# Patient Record
Sex: Female | Born: 1955 | Race: White | Hispanic: No | Marital: Single | Smoking: Former smoker
Health system: Southern US, Community
[De-identification: ages and names within clinical notes are randomized; demographics above are authoritative.]

## PROBLEM LIST (undated history)

## (undated) DIAGNOSIS — F419 Anxiety disorder, unspecified: Secondary | ICD-10-CM

## (undated) DIAGNOSIS — F329 Major depressive disorder, single episode, unspecified: Secondary | ICD-10-CM

## (undated) DIAGNOSIS — F32A Depression, unspecified: Secondary | ICD-10-CM

## (undated) DIAGNOSIS — E785 Hyperlipidemia, unspecified: Secondary | ICD-10-CM

## (undated) DIAGNOSIS — M7710 Lateral epicondylitis, unspecified elbow: Secondary | ICD-10-CM

## (undated) DIAGNOSIS — K219 Gastro-esophageal reflux disease without esophagitis: Secondary | ICD-10-CM

## (undated) DIAGNOSIS — I10 Essential (primary) hypertension: Secondary | ICD-10-CM

## (undated) HISTORY — DX: Hyperlipidemia, unspecified: E78.5

## (undated) HISTORY — PX: WRIST ARTHROSCOPY: SUR100

## (undated) HISTORY — DX: Anxiety disorder, unspecified: F41.9

## (undated) HISTORY — DX: Major depressive disorder, single episode, unspecified: F32.9

## (undated) HISTORY — PX: TYMPANOPLASTY: SHX33

## (undated) HISTORY — DX: Depression, unspecified: F32.A

## (undated) HISTORY — DX: Essential (primary) hypertension: I10

## (undated) HISTORY — DX: Lateral epicondylitis, unspecified elbow: M77.10

---

## 1998-08-12 DIAGNOSIS — C4492 Squamous cell carcinoma of skin, unspecified: Secondary | ICD-10-CM

## 1998-08-12 HISTORY — DX: Squamous cell carcinoma of skin, unspecified: C44.92

## 1998-09-16 ENCOUNTER — Ambulatory Visit (HOSPITAL_BASED_OUTPATIENT_CLINIC_OR_DEPARTMENT_OTHER): Admission: RE | Admit: 1998-09-16 | Discharge: 1998-09-16 | Payer: Self-pay | Admitting: Plastic Surgery

## 1998-09-16 DIAGNOSIS — C4492 Squamous cell carcinoma of skin, unspecified: Secondary | ICD-10-CM

## 1998-09-16 HISTORY — DX: Squamous cell carcinoma of skin, unspecified: C44.92

## 2000-01-21 ENCOUNTER — Encounter: Admission: RE | Admit: 2000-01-21 | Discharge: 2000-01-21 | Payer: Self-pay | Admitting: *Deleted

## 2000-01-21 ENCOUNTER — Encounter: Payer: Self-pay | Admitting: *Deleted

## 2000-03-11 ENCOUNTER — Encounter: Admission: RE | Admit: 2000-03-11 | Discharge: 2000-06-09 | Payer: Self-pay | Admitting: *Deleted

## 2001-07-24 ENCOUNTER — Emergency Department (HOSPITAL_COMMUNITY): Admission: EM | Admit: 2001-07-24 | Discharge: 2001-07-25 | Payer: Self-pay | Admitting: Emergency Medicine

## 2001-07-24 ENCOUNTER — Encounter: Payer: Self-pay | Admitting: Emergency Medicine

## 2001-10-05 ENCOUNTER — Other Ambulatory Visit: Admission: RE | Admit: 2001-10-05 | Discharge: 2001-10-05 | Payer: Self-pay | Admitting: *Deleted

## 2001-12-09 ENCOUNTER — Other Ambulatory Visit: Admission: RE | Admit: 2001-12-09 | Discharge: 2001-12-09 | Payer: Self-pay | Admitting: Family Medicine

## 2002-05-22 ENCOUNTER — Encounter: Payer: Self-pay | Admitting: Emergency Medicine

## 2002-05-22 ENCOUNTER — Emergency Department (HOSPITAL_COMMUNITY): Admission: EM | Admit: 2002-05-22 | Discharge: 2002-05-22 | Payer: Self-pay | Admitting: Emergency Medicine

## 2004-03-31 ENCOUNTER — Ambulatory Visit: Payer: Self-pay | Admitting: Family Medicine

## 2004-05-07 ENCOUNTER — Ambulatory Visit: Payer: Self-pay | Admitting: Family Medicine

## 2004-05-07 ENCOUNTER — Other Ambulatory Visit: Admission: RE | Admit: 2004-05-07 | Discharge: 2004-05-07 | Payer: Self-pay | Admitting: Family Medicine

## 2004-05-30 ENCOUNTER — Ambulatory Visit: Payer: Self-pay | Admitting: Family Medicine

## 2004-06-04 ENCOUNTER — Ambulatory Visit: Payer: Self-pay | Admitting: Family Medicine

## 2004-06-10 ENCOUNTER — Ambulatory Visit: Payer: Self-pay | Admitting: Family Medicine

## 2007-09-17 ENCOUNTER — Emergency Department (HOSPITAL_COMMUNITY): Admission: EM | Admit: 2007-09-17 | Discharge: 2007-09-18 | Payer: Self-pay | Admitting: Emergency Medicine

## 2008-05-21 ENCOUNTER — Encounter: Admission: RE | Admit: 2008-05-21 | Discharge: 2008-06-21 | Payer: Self-pay | Admitting: Orthopedic Surgery

## 2009-03-01 ENCOUNTER — Other Ambulatory Visit: Admission: RE | Admit: 2009-03-01 | Discharge: 2009-03-01 | Payer: Self-pay | Admitting: Internal Medicine

## 2010-03-06 ENCOUNTER — Ambulatory Visit (HOSPITAL_COMMUNITY)
Admission: RE | Admit: 2010-03-06 | Discharge: 2010-03-06 | Payer: Self-pay | Source: Home / Self Care | Attending: Gastroenterology | Admitting: Gastroenterology

## 2010-03-06 HISTORY — PX: COLONOSCOPY W/ BIOPSIES: SHX1374

## 2010-08-04 ENCOUNTER — Other Ambulatory Visit: Payer: Self-pay | Admitting: Internal Medicine

## 2010-08-04 ENCOUNTER — Other Ambulatory Visit (HOSPITAL_COMMUNITY)
Admission: RE | Admit: 2010-08-04 | Discharge: 2010-08-04 | Disposition: A | Discharge status: Home / Self Care | Payer: 59 | Source: Ambulatory Visit | Attending: Internal Medicine | Admitting: Internal Medicine

## 2010-08-04 DIAGNOSIS — Z01419 Encounter for gynecological examination (general) (routine) without abnormal findings: Secondary | ICD-10-CM | POA: Insufficient documentation

## 2010-08-04 DIAGNOSIS — Z1159 Encounter for screening for other viral diseases: Secondary | ICD-10-CM | POA: Insufficient documentation

## 2010-12-09 ENCOUNTER — Other Ambulatory Visit: Payer: Self-pay | Admitting: Physician Assistant

## 2012-07-28 ENCOUNTER — Other Ambulatory Visit: Payer: Self-pay | Admitting: Physician Assistant

## 2013-02-01 ENCOUNTER — Ambulatory Visit (INDEPENDENT_AMBULATORY_CARE_PROVIDER_SITE_OTHER): Payer: 59 | Admitting: Sports Medicine

## 2013-02-01 ENCOUNTER — Encounter: Payer: Self-pay | Admitting: Sports Medicine

## 2013-02-01 VITALS — BP 149/92 | Ht 66.0 in | Wt 117.0 lb

## 2013-02-01 DIAGNOSIS — M7710 Lateral epicondylitis, unspecified elbow: Secondary | ICD-10-CM

## 2013-02-01 DIAGNOSIS — M7711 Lateral epicondylitis, right elbow: Secondary | ICD-10-CM

## 2013-02-01 MED ORDER — NITROGLYCERIN 0.2 MG/HR TD PT24: MEDICATED_PATCH | TRANSDERMAL | Status: DC

## 2013-02-01 NOTE — Progress Notes (Signed)
  Subjective:    Patient ID: Tiffany Curtis, female    DOB: Jun 04, 1955, 57 y.o.   MRN: [id number]  HPI chief complaint: Right elbow pain  Very pleasant 57 year old female comes in today complaining of 2 months of right elbow pain. Pain began while playing tennis. She had her tennis racket re-strung and she feels like the tension was way too much. Immediately after that she began to experience lateral elbow pain which has persisted for the past 8 weeks. Pain was initially only present with tennis but is now to the point to where it is interfering with her regular daily activities. She describes a burning sensation in the lateral elbow which is present mainly with wrist related activity such as picking up a coffee cup or turning a doorknob. He has purchased an off-the-shelf cockup wrist brace which has been somewhat helpful. No associated numbness or tingling. Pain improves at rest. She has not noticed any swelling. No prior elbow surgeries. No pain more proximally in her neck.  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 No acute distress. Vital signs are reviewed  Right elbow: Full range of motion. No effusion. No soft tissue swelling. There is tenderness to palpation directly over the lateral epicondyles with reproducible pain with resisted ECRB testing. No tenderness over the medial epicondyle. No tenderness to palpation at the radial tunnel. Elbow is stable to ligamentous exam. Good radial and ulnar pulses. Decreased grip strength secondary to pain.  MSK ultrasound of the right elbow: Limited images of the lateral elbow were obtained. There is a hypoechoic area along the superficial most portion of the common extensor tendon suggestive  of a tear here. It does not appear to be a full-thickness tear. There is also a traction spur seen at the lateral epicondyle.       Assessment & Plan:  Right elbow pain secondary to lateral epicondylitis with ultrasound evidence of probable partial common extensor tendon tear  1/4 patch nitroglycerin daily. Home eccentric exercises and stretching. She may continue with her wrist brace or alternatively she can try a counterforce brace if she finds it more comfortable. Followup with me in 4-6 weeks for repeat ultrasound. If symptoms persist or worsen I would consider the merits of a single cortisone injection prior to further imaging. Patient will call me with questions or concerns in the interim.

## 2013-02-01 NOTE — Patient Instructions (Signed)

## 2013-03-02 ENCOUNTER — Encounter: Payer: Self-pay | Admitting: Sports Medicine

## 2013-03-02 ENCOUNTER — Ambulatory Visit (INDEPENDENT_AMBULATORY_CARE_PROVIDER_SITE_OTHER): Payer: 59 | Admitting: Sports Medicine

## 2013-03-02 VITALS — BP 158/91 | HR 65 | Ht 66.0 in | Wt 117.0 lb

## 2013-03-02 DIAGNOSIS — M7710 Lateral epicondylitis, unspecified elbow: Secondary | ICD-10-CM

## 2013-03-02 DIAGNOSIS — M7711 Lateral epicondylitis, right elbow: Secondary | ICD-10-CM

## 2013-03-02 NOTE — Progress Notes (Signed)
   Subjective:    Patient ID: Tiffany Curtis, female    DOB: 14-Sep-1955, 57 y.o.   MRN: [id number]  HPI Patient comes in today for followup on right elbow lateral epicondylitis. She admits that she has not been compliant with her home exercises. She has however been using her topical nitroglycerin and has noted about a 10% improvement with this. She is tolerating it without headaches. She is not currently playing tennis. She is using a wrist brace on occasion. Currently her symptoms are tolerable.    Review of Systems     Objective:   Physical Exam Well-developed, well-nourished. No acute distress  Right elbow: Full range of motion. No effusion. There is still tenderness to palpation at the lateral epicondyle and reproducible pain with ECRB testing. No soft tissue swelling. Neurovascularly intact distally.  MSK ultrasound of the right elbow: Limited images of the lateral elbow were obtained. The hypoechoic area seen on her previous scan is once again appreciated. It is seen both in long and short view. It is unchanged when compared to the previous scan.       Assessment & Plan:  Right elbow pain secondary to lateral epicondylitis/tendinopathy  Patient assures me that she will be more compliant with her home exercises. Continue with nitroglycerin. She understands that this condition can take several weeks or months before resolving. I will see her back in the office in 4-6 weeks for repeat ultrasound. She will call me with questions or concerns in the interim.

## 2013-04-06 ENCOUNTER — Ambulatory Visit: Payer: 59 | Admitting: Sports Medicine

## 2013-04-24 ENCOUNTER — Ambulatory Visit (INDEPENDENT_AMBULATORY_CARE_PROVIDER_SITE_OTHER): Payer: 59 | Admitting: Sports Medicine

## 2013-04-24 ENCOUNTER — Encounter: Payer: Self-pay | Admitting: Sports Medicine

## 2013-04-24 VITALS — BP 166/114 | Ht 66.0 in | Wt 117.0 lb

## 2013-04-24 DIAGNOSIS — M7710 Lateral epicondylitis, unspecified elbow: Secondary | ICD-10-CM

## 2013-04-24 MED ORDER — NITROGLYCERIN 0.2 MG/HR TD PT24: MEDICATED_PATCH | TRANSDERMAL | Status: DC

## 2013-04-24 NOTE — Progress Notes (Signed)
Tiffany Curtis is a 58 y.o. female who presents to [facility] today for Lateral Epicondylitis (R)  R lateral epicondylitis: Decreased physical activity of the R arm w/ improvement. Doing exercises maybe 5 days out of the week but not w/ great effort. Also no longer wt lifting.  Accupuncture perhaps w/ some benefit. No medications. Improved overall.  Using Nitro patches as prescribed  HTN: BP elevated over past several reads in our officice. Sick today w/ URI and w/ decongestants on board. W/o HA or CP. HTN runs in family. No previous treatment for HTN.    PMH reviewed.  ROS as above otherwise neg Medications reviewed.  Exam:  BP 166/114  Ht 5\' 6"  (1.676 m)  Wt 117 lb (53.071 kg)  BMI 18.89 kg/m2 Gen: Well NAD MSK: FROM of R arm, mildly ttp along the lat epicondyle. Mild pain w/ resistance ECRB testing. No edema.   In office [place] w/ small spurring of the lat epicondyle and hypoechoic area that is diminished from previous examination likely indicating some degree of healing.    Assessment and Plan: 1) 58yo F w/ lateral epicondylitis that is improving. - continue nitro patches - continue exercises until able to meet w/ PT Jenny Reichmann) to determine further exercises - continue to stay out of tennis until after next appt - return in 4 wks. We'll repeat her ultrasound at that time. - seek attention from PCP for HTN. Precautions given for Hypertensive emergency  Linna Darner, MD Family Medicine PGY-3 04/24/2013, 10:31 AM

## 2013-04-24 NOTE — Patient Instructions (Signed)
Thank you for coming in today Your elbow is making progress Please continue your home exercises until you meet with Jenny Reichmann (physical therapist) to go over new ones Please continue the nitroglycerin patches Please return in 4 weeks.  Please follow up with your PCP regarding your blood pressure Have a wonderful day.

## 2013-05-22 ENCOUNTER — Ambulatory Visit (INDEPENDENT_AMBULATORY_CARE_PROVIDER_SITE_OTHER): Payer: 59 | Admitting: Sports Medicine

## 2013-05-22 ENCOUNTER — Encounter: Payer: Self-pay | Admitting: Sports Medicine

## 2013-05-22 VITALS — BP 148/86 | HR 54 | Ht 66.0 in | Wt 117.0 lb

## 2013-05-22 DIAGNOSIS — M7710 Lateral epicondylitis, unspecified elbow: Secondary | ICD-10-CM

## 2013-05-22 NOTE — Progress Notes (Signed)
   Subjective:    Patient ID: Tiffany Curtis, female    DOB: [birth year], 58 y.o.   MRN: [id number]  HPI Patient comes in today for followup on right elbow lateral epicondylitis. Since her last office visit she has started physical therapy. She is working with Vivi Ferns. Overall, she has had about a 50% improvement in her symptoms. She continues to use her nitroglycerin patch. She has not returned to tennis.    Review of Systems     Objective:   Physical Exam Well-developed, well-nourished. No acute distress. Awake alert and oriented x3  Right elbow: Full range of motion. No effusion. No soft tissue swelling. There is still some slight tenderness to palpation over the lateral epicondyle and reproducible pain with resisted ECRB testing, although not marked. Good grip strength. Neurovascular intact distally.  MSK ultrasound of the right elbow: Limited images of the lateral elbow were obtained. Once again seen is a small spur off of the lateral epicondyle. There is a small hypoechoic area at the insertion of the common extensor tendon but it is much smaller than on her previous exam.       Assessment & Plan:  Improving right elbow lateral epicondylitis  Continue with topical nitroglycerin. Continue to work with Vivi Ferns. Shanon Brow has recommended trying some dry needling and I do think this may be effective. Return to the office in 4 weeks for reevaluation and repeat ultrasound. No tennis in the interim. Call with questions or concerns in the interim.

## 2013-06-22 ENCOUNTER — Encounter: Payer: Self-pay | Admitting: Sports Medicine

## 2013-06-22 ENCOUNTER — Ambulatory Visit (INDEPENDENT_AMBULATORY_CARE_PROVIDER_SITE_OTHER): Payer: 59 | Admitting: Sports Medicine

## 2013-06-22 VITALS — BP 127/81 | HR 64 | Ht 66.0 in | Wt 117.0 lb

## 2013-06-22 DIAGNOSIS — M7710 Lateral epicondylitis, unspecified elbow: Secondary | ICD-10-CM

## 2013-06-23 NOTE — Progress Notes (Signed)
   Subjective:    Patient ID: Tiffany Curtis, female    DOB: October 01, 1955, 58 y.o.   MRN: [id number]  HPI Patient comes in today for followup on lateral epicondylitis of the right elbow. Overall, she is feeling much better. Minimal pain over the lateral epicondyle. She has returned to some very light tennis. She has completed physical therapy (she was working with Vivi Ferns). She is continuing to use a quarter patch of nitroglycerin daily. She is anxious to increase her tennis activity.    Review of Systems     Objective:   Physical Exam Well-developed, well-nourished. No acute distress. Awake alert and oriented x3. Vital signs reviewed.  Right elbow: Full range of motion. No effusion. No soft tissue swelling. Minimal tenderness to palpation over the lateral epicondyles. Minimal tenderness with resisted ECRB testing. Good grip strength. Good radial and ulnar pulses.  MSK ultrasound of the right elbow was performed. Limited images of the lateral elbow were obtained. Images were compared to prior scans. Once again appreciated is the spur off of the lateral epicondyle. The large hypoechoic area is seen within the common extensor tendon on previous scans is much less evident. In fact, it appears to have regained most of his normal structure. This finding is consistent with a nearly healed partial tear of the common extensor tendon.       Assessment & Plan:  Much improved lateral epicondylitis, right elbow  I've advised the patient to continue with her nitroglycerin patch for another 3 weeks and then discontinue it altogether. She understands the importance of continuing with her home exercises. I think she can start to increase her tennis activity as tolerated but she will make sure that she has a properly fitted racquet and will work with a Audiological scientist on proper form. Followup with me when necessary.

## 2014-07-25 ENCOUNTER — Ambulatory Visit (INDEPENDENT_AMBULATORY_CARE_PROVIDER_SITE_OTHER): Payer: 59 | Admitting: Internal Medicine

## 2014-07-25 ENCOUNTER — Encounter: Payer: Self-pay | Admitting: Internal Medicine

## 2014-07-25 VITALS — BP 134/76 | HR 64 | Ht 66.0 in | Wt 121.2 lb

## 2014-07-25 DIAGNOSIS — R1314 Dysphagia, pharyngoesophageal phase: Secondary | ICD-10-CM

## 2014-07-25 DIAGNOSIS — R1310 Dysphagia, unspecified: Secondary | ICD-10-CM

## 2014-07-25 DIAGNOSIS — R1319 Other dysphagia: Secondary | ICD-10-CM

## 2014-07-25 NOTE — Progress Notes (Signed)
Subjective:    Patient ID: Tiffany Curtis, female    DOB: 1956-02-09, 59 y.o.   MRN: [id number] Chief complaint: Esophageal spasms HPI The patient is here, self-referred Tiffany Curtis she has intermittent problems with chest pain and swallowing difficulty. It is not clearly completely related to eating but it seems to occur around that. It started several years ago where she got chest pain and a lot of saliva building up in her mouth and she felt like she couldn't swallow properly. Intermittent but more frequent. There is no classic sticking point but she gets this midsternal chest pain and she regurgitate small pieces of food. She thought she has probably had esophageal spasms and has just dealt with it. She has not talked other physicians about it. She previously had a colonoscopy with polyps by Dr. Earle Curtis and would be due for a routine repeat colonoscopy 5 years after the last at the end of this year. 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 There is no unintentional weight loss or bleeding or other problems. No Known Allergies Outpatient Prescriptions Prior to Visit  Medication Sig Dispense Refill  . losartan-hydrochlorothiazide (HYZAAR) 50-12.5 MG per tablet Take 1 tablet by mouth daily.    . nitroGLYCERIN (NITRODUR - DOSED IN MG/24 HR) 0.2 mg/hr patch Use 1/4 patch to the affected area every 24 hours 30 patch 1   No facility-administered medications prior to visit.   Past Medical History  Diagnosis Date  . Lateral epicondylitis (tennis elbow)     right  . Tubular adenoma of colon 03/06/2010  . Hypertension   . Anxiety and depression    Past Surgical History  Procedure Laterality Date  . Colonoscopy w/ biopsies  03/06/2010    Dr. Earle Curtis  . Wrist arthroscopy    . Tympanoplasty     History   Social History  . Marital Status: Single    Spouse Name: N/A  . Number of Children: N/A  . Years of Education: N/A   Social History Main Topics  .  Smoking status: Former Research scientist (life sciences)  . Smokeless tobacco: Never Used  . Alcohol Use: 0.0 oz/week    0 Standard drinks or equivalent per week  . Drug Use: No  . Sexual Activity: Not on file   Other Topics Concern  . 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 Liver cancer Mother   . Heart disease Father     Review of Systems As per history of present illness. She has seasonal allergies. All other review of systems are negative.    Objective:   Physical Exam @BP  134/76 mmHg  Pulse 64  Ht 5\' 6"  (1.676 m)  Wt 121 lb 3.2 oz (54.976 kg)  BMI 19.57 kg/m2@  General:  Well-developed, well-nourished and in no acute distress Eyes:  anicteric. ENT:   Mouth and posterior pharynx free of lesions.  Neck:   supple w/o thyromegaly or mass.  Lungs: Clear to auscultation bilaterally. Heart:  S1S2, no rubs, murmurs, gallops. Abdomen:  soft, non-tender, no hepatosplenomegaly, hernia, or mass and BS+.  Lymph:  no cervical or supraclavicular adenopathy. Extremities:   no edema, cyanosis or clubbing Neuro:  A&O x 3.  Psych:  appropriate mood and  Affect.   Data Reviewed:  2011 colonoscopy and pathology report      Assessment & Plan:  Esophageal dysphagia   Chronic problem - ? Motility vs stricture vs both Neoplasm seems very unlikely given duration and lack of other sxs  Plan for upper endoscopy and possible esophageal dilation  The risks and benefits as well as alternatives of endoscopic procedure(s) have been discussed and reviewed. All questions answered. The patient agrees to proceed.  I appreciate the opportunity to care for this patient.  CC: Tiffany NEVILL, MD

## 2014-07-25 NOTE — Patient Instructions (Signed)
  You have been scheduled for an endoscopy. Please follow written instructions given to you at your visit today. If you use inhalers (even only as needed), please bring them with you on the day of your procedure.   I appreciate the opportunity to care for you. Carl Gessner, MD, FACG 

## 2014-07-26 ENCOUNTER — Encounter: Payer: Self-pay | Admitting: Internal Medicine

## 2014-07-27 ENCOUNTER — Encounter: Payer: Self-pay | Admitting: Internal Medicine

## 2014-08-14 ENCOUNTER — Ambulatory Visit (AMBULATORY_SURGERY_CENTER): Payer: 59 | Admitting: Internal Medicine

## 2014-08-14 ENCOUNTER — Encounter: Payer: Self-pay | Admitting: Internal Medicine

## 2014-08-14 VITALS — BP 122/78 | HR 58 | Temp 97.3°F | Resp 18 | Ht 66.0 in | Wt 121.0 lb

## 2014-08-14 DIAGNOSIS — K222 Esophageal obstruction: Secondary | ICD-10-CM

## 2014-08-14 DIAGNOSIS — R1314 Dysphagia, pharyngoesophageal phase: Secondary | ICD-10-CM

## 2014-08-14 DIAGNOSIS — R1310 Dysphagia, unspecified: Secondary | ICD-10-CM

## 2014-08-14 DIAGNOSIS — R1319 Other dysphagia: Secondary | ICD-10-CM

## 2014-08-14 MED ORDER — PANTOPRAZOLE SODIUM 40 MG PO TBEC: 40.0000 mg | DELAYED_RELEASE_TABLET | Freq: Every day | ORAL | Status: DC

## 2014-08-14 MED ORDER — SODIUM CHLORIDE 0.9 % IV SOLN: 500.0000 mL | INTRAVENOUS | Status: DC

## 2014-08-14 NOTE — Progress Notes (Signed)
Called to room to assist during endoscopic procedure.  Patient ID and intended procedure confirmed with present staff. Received instructions for my participation in the procedure from the performing physician.  

## 2014-08-14 NOTE — Op Note (Signed)
[facility]  [address] [place], [id number]   ENDOSCOPY PROCEDURE REPORT  PATIENT: Tiffany Curtis, Tiffany Curtis  MR#: [id number] BIRTHDATE: 21-May-1955 , 58  yrs. old GENDER: female ENDOSCOPIST: Gatha Mayer, MD, [facility] PROCEDURE DATE:  08/14/2014 PROCEDURE:  EGD w/ balloon dilation ASA CLASS:     Class II INDICATIONS:  dysphagia. MEDICATIONS: Propofol 250 mg IV and Monitored anesthesia care TOPICAL ANESTHETIC: none  DESCRIPTION OF PROCEDURE: After the risks benefits and alternatives of the procedure were thoroughly explained, informed consent was obtained.  The LB [id number] endoscope was introduced through the mouth and advanced to the second portion of the duodenum , Without limitations.  The instrument was slowly withdrawn as the mucosa was fully examined.    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 2) small sliding hiatal hernia 2 cm 3) Otherwise normal EGD.  Retroflexed views revealed as previously described.     The scope was then withdrawn from the patient and the procedure completed.  COMPLICATIONS: There were no immediate complications.  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 2) small sliding hiatal hernia 2 cm 3) Otherwise normal EGD  RECOMMENDATIONS: Clear liquids until noon  , then soft foods rest of day.  Resume prior diet tomorrow. Start pantoprazole 40 mg daily - would maintain on this to reduce recurrent stricture formation   eSigned:  Gatha Mayer, MD, [facility] 08/14/2014 10:51 AM    CC: R. Marcellus Scott, MD and The Patient

## 2014-08-14 NOTE — Progress Notes (Signed)
A/ox3 pleased with MAC, report to Wendy RN 

## 2014-08-14 NOTE — Patient Instructions (Addendum)
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 I dilated it so you should be able to swallow better. This is almost always caused by esophageal reflux so I am prescribing pantoprazole - a PPI medication. I recommend you take this on a daily basis to reduce chance of recurrent stricture.  If I have not fixed your problem please call me back.  I appreciate the opportunity to care for you. Gatha Mayer, MD, FACG   YOU HAD AN ENDOSCOPIC PROCEDURE TODAY AT [facility]:   Refer to the procedure report that was given to you for any specific questions about what was found during the examination.  If the procedure report does not answer your questions, please call your gastroenterologist to clarify.  If you requested that your care partner not be given the details of your procedure findings, then the procedure report has been included in a sealed envelope for you to review at your convenience later.  YOU SHOULD EXPECT: Some feelings of bloating in the abdomen. Passage of more gas than usual.  Walking can help get rid of the air that was put into your GI tract during the procedure and reduce the bloating. If you had a lower endoscopy (such as a colonoscopy or flexible sigmoidoscopy) you may notice spotting of blood in your stool or on the toilet paper. If you underwent a bowel prep for your procedure, you may not have a normal bowel movement for a few days.  Please Note:  You might notice some irritation and congestion in your nose or some drainage.  This is from the oxygen used during your procedure.  There is no need for concern and it should clear up in a day or so.  SYMPTOMS TO REPORT IMMEDIATELY:   Following upper endoscopy (EGD)  Vomiting of blood or coffee ground material  New chest pain or pain under the shoulder blades  Painful or persistently difficult swallowing  New shortness of breath  Fever of 100F or higher  Black, tarry-looking stools  For urgent or  emergent issues, a gastroenterologist can be reached at any hour by calling [phone].   DIET: Your first meal following the procedure should be a small meal and then it is ok to progress to your normal diet. Heavy or fried foods are harder to digest and may make you feel nauseous or bloated.  Likewise, meals heavy in dairy and vegetables can increase bloating.  Drink plenty of fluids but you should avoid alcoholic beverages for 24 hours.  ACTIVITY:  You should plan to take it easy for the rest of today and you should NOT DRIVE or use heavy machinery until tomorrow (because of the sedation medicines used during the test).    FOLLOW UP: Our staff will call the number listed on your records the next business day following your procedure to check on you and address any questions or concerns that you may have regarding the information given to you following your procedure. If we do not reach you, we will leave a message.  However, if you are feeling well and you are not experiencing any problems, there is no need to return our call.  We will assume that you have returned to your regular daily activities without incident.  If any biopsies were taken you will be contacted by phone or by letter within the next 1-3 weeks.  Please call us at [phone] if you have not heard about the biopsies in 3  weeks.    SIGNATURES/CONFIDENTIALITY: You and/or your care partner have signed paperwork which will be entered into your electronic medical record.  These signatures attest to the fact that that the information above on your After Visit Summary has been reviewed and is understood.  Full responsibility of the confidentiality of this discharge information lies with you and/or your care-partner.  Please review stricture and dilation diet handouts provided.

## 2014-08-15 ENCOUNTER — Telehealth: Payer: Self-pay | Admitting: *Deleted

## 2014-08-15 NOTE — Telephone Encounter (Signed)
  Follow up Call-  Call back number 08/14/2014  Post procedure Call Back phone  # [phone]  Permission to leave phone message Yes     Patient questions:  Do you have a fever, pain , or abdominal swelling? No. Pain Score  0 *  Have you tolerated food without any problems? Yes.    Have you been able to return to your normal activities? Yes.    Do you have any questions about your discharge instructions: Diet   No. Medications  No. Follow up visit  No.  Do you have questions or concerns about your Care? No.  Actions: * If pain score is 4 or above: No action needed, pain <4.

## 2015-04-05 MED FILL — LEVOCETIRIZINE 5 MG TABLET: 5 | 30 days supply | Qty: 30 | Fill #1

## 2015-04-11 MED FILL — PRISTIQ ER 50 MG TABLET: 50 | 30 days supply | Qty: 30 | Fill #3

## 2015-04-15 DIAGNOSIS — Z1231 Encounter for screening mammogram for malignant neoplasm of breast: Secondary | ICD-10-CM | POA: Diagnosis not present

## 2015-05-09 MED FILL — PRISTIQ ER 50 MG TABLET: 50 | 30 days supply | Qty: 30 | Fill #4

## 2015-05-09 MED FILL — hydrOXYzine HCL 25 MG TABS: 25 | 30 days supply | Qty: 90 | Fill #1

## 2015-05-28 MED FILL — VYVANSE 40 MG CAPSULE: 40 | 90 days supply | Qty: 90 | Fill #0

## 2015-06-11 MED FILL — PRISTIQ ER 50 MG TABLET: 50 | 30 days supply | Qty: 30 | Fill #5

## 2015-06-24 MED FILL — LOSARTAN POTASSIUM 50 MG TA: 50 | 90 days supply | Qty: 90 | Fill #3

## 2015-07-12 MED FILL — DESVENLAFAXINE ER 50 MG TAB: 50 | 30 days supply | Qty: 30 | Fill #6

## 2015-07-18 DIAGNOSIS — F331 Major depressive disorder, recurrent, moderate: Secondary | ICD-10-CM | POA: Diagnosis not present

## 2015-07-18 DIAGNOSIS — F605 Obsessive-compulsive personality disorder: Secondary | ICD-10-CM | POA: Diagnosis not present

## 2015-07-31 DIAGNOSIS — M654 Radial styloid tenosynovitis [de Quervain]: Secondary | ICD-10-CM | POA: Diagnosis not present

## 2015-07-31 MED FILL — MELOXICAM 15 MG TABLET: 15 | 30 days supply | Qty: 30 | Fill #0

## 2015-08-01 DIAGNOSIS — M6281 Muscle weakness (generalized): Secondary | ICD-10-CM | POA: Diagnosis not present

## 2015-08-01 DIAGNOSIS — M79602 Pain in left arm: Secondary | ICD-10-CM | POA: Diagnosis not present

## 2015-08-01 DIAGNOSIS — M654 Radial styloid tenosynovitis [de Quervain]: Secondary | ICD-10-CM | POA: Diagnosis not present

## 2015-08-02 MED FILL — hydrOXYzine HCL 25 MG TABS: 25 | 30 days supply | Qty: 90 | Fill #2

## 2015-08-06 DIAGNOSIS — M79602 Pain in left arm: Secondary | ICD-10-CM | POA: Diagnosis not present

## 2015-08-06 DIAGNOSIS — M6281 Muscle weakness (generalized): Secondary | ICD-10-CM | POA: Diagnosis not present

## 2015-08-06 DIAGNOSIS — M654 Radial styloid tenosynovitis [de Quervain]: Secondary | ICD-10-CM | POA: Diagnosis not present

## 2015-08-08 DIAGNOSIS — M654 Radial styloid tenosynovitis [de Quervain]: Secondary | ICD-10-CM | POA: Diagnosis not present

## 2015-08-08 DIAGNOSIS — M6281 Muscle weakness (generalized): Secondary | ICD-10-CM | POA: Diagnosis not present

## 2015-08-08 DIAGNOSIS — M79602 Pain in left arm: Secondary | ICD-10-CM | POA: Diagnosis not present

## 2015-08-12 MED FILL — DESVENLAFAXINE ER 50 MG TAB: 50 | 30 days supply | Qty: 30 | Fill #7

## 2015-08-13 DIAGNOSIS — M79602 Pain in left arm: Secondary | ICD-10-CM | POA: Diagnosis not present

## 2015-08-13 DIAGNOSIS — M654 Radial styloid tenosynovitis [de Quervain]: Secondary | ICD-10-CM | POA: Diagnosis not present

## 2015-08-13 DIAGNOSIS — M6281 Muscle weakness (generalized): Secondary | ICD-10-CM | POA: Diagnosis not present

## 2015-08-28 MED FILL — VYVANSE 40 MG CAPSULE: 40 | 90 days supply | Qty: 90 | Fill #0

## 2015-09-02 DIAGNOSIS — H1032 Unspecified acute conjunctivitis, left eye: Secondary | ICD-10-CM | POA: Diagnosis not present

## 2015-09-02 MED FILL — TOBRAMYCIN-DEXAMETH OPTH SU: 0.3-0.1 | 25 days supply | Qty: 5 | Fill #0

## 2015-09-10 DIAGNOSIS — S0502XD Injury of conjunctiva and corneal abrasion without foreign body, left eye, subsequent encounter: Secondary | ICD-10-CM | POA: Diagnosis not present

## 2015-09-12 MED FILL — DESVENLAFAXINE ER 50 MG TAB: 50 | 90 days supply | Qty: 90 | Fill #0

## 2015-09-13 DIAGNOSIS — B0052 Herpesviral keratitis: Secondary | ICD-10-CM | POA: Diagnosis not present

## 2015-09-13 MED FILL — ZIRGAN 0.15% OPHTHALMIC GEL: 0.15 | 15 days supply | Qty: 5 | Fill #0

## 2015-09-15 DIAGNOSIS — B0052 Herpesviral keratitis: Secondary | ICD-10-CM | POA: Diagnosis not present

## 2015-09-23 DIAGNOSIS — B0052 Herpesviral keratitis: Secondary | ICD-10-CM | POA: Diagnosis not present

## 2015-09-25 DIAGNOSIS — Z79899 Other long term (current) drug therapy: Secondary | ICD-10-CM | POA: Diagnosis not present

## 2015-09-25 DIAGNOSIS — G4700 Insomnia, unspecified: Secondary | ICD-10-CM | POA: Diagnosis not present

## 2015-09-25 DIAGNOSIS — K222 Esophageal obstruction: Secondary | ICD-10-CM | POA: Diagnosis not present

## 2015-09-25 DIAGNOSIS — F909 Attention-deficit hyperactivity disorder, unspecified type: Secondary | ICD-10-CM | POA: Diagnosis not present

## 2015-09-25 DIAGNOSIS — E785 Hyperlipidemia, unspecified: Secondary | ICD-10-CM | POA: Diagnosis not present

## 2015-09-25 DIAGNOSIS — F329 Major depressive disorder, single episode, unspecified: Secondary | ICD-10-CM | POA: Diagnosis not present

## 2015-09-25 DIAGNOSIS — H9190 Unspecified hearing loss, unspecified ear: Secondary | ICD-10-CM | POA: Diagnosis not present

## 2015-09-25 DIAGNOSIS — I10 Essential (primary) hypertension: Secondary | ICD-10-CM | POA: Diagnosis not present

## 2015-09-25 DIAGNOSIS — Z0001 Encounter for general adult medical examination with abnormal findings: Secondary | ICD-10-CM | POA: Diagnosis not present

## 2015-10-02 ENCOUNTER — Other Ambulatory Visit: Payer: Self-pay | Admitting: Gastroenterology

## 2015-10-02 MED FILL — GAVILYTE-N SOLUTION: 420 | 1 days supply | Qty: 4000 | Fill #0

## 2015-10-03 MED FILL — LOSARTAN POTASSIUM 50 MG TA: 50 | 90 days supply | Qty: 90 | Fill #0

## 2015-10-10 MED FILL — hydrOXYzine HCL 25 MG TABS: 25 | 30 days supply | Qty: 90 | Fill #3

## 2015-11-08 ENCOUNTER — Encounter (HOSPITAL_COMMUNITY): Payer: Self-pay | Admitting: *Deleted

## 2015-11-13 DIAGNOSIS — J301 Allergic rhinitis due to pollen: Secondary | ICD-10-CM | POA: Diagnosis not present

## 2015-11-13 DIAGNOSIS — H1045 Other chronic allergic conjunctivitis: Secondary | ICD-10-CM | POA: Diagnosis not present

## 2015-11-13 DIAGNOSIS — L298 Other pruritus: Secondary | ICD-10-CM | POA: Diagnosis not present

## 2015-11-13 DIAGNOSIS — J3089 Other allergic rhinitis: Secondary | ICD-10-CM | POA: Diagnosis not present

## 2015-11-18 ENCOUNTER — Encounter (HOSPITAL_COMMUNITY): Payer: Self-pay | Admitting: *Deleted

## 2015-11-18 ENCOUNTER — Ambulatory Visit (HOSPITAL_COMMUNITY)
Admission: RE | Admit: 2015-11-18 | Discharge: 2015-11-18 | Disposition: A | Discharge status: Home / Self Care | Payer: 59 | Source: Ambulatory Visit | Attending: Gastroenterology | Admitting: Gastroenterology

## 2015-11-18 ENCOUNTER — Ambulatory Visit (HOSPITAL_COMMUNITY): Payer: 59 | Admitting: Registered Nurse

## 2015-11-18 ENCOUNTER — Encounter (HOSPITAL_COMMUNITY)
Admission: RE | Disposition: A | Discharge status: Home / Self Care | Payer: Self-pay | Source: Ambulatory Visit | Attending: Gastroenterology

## 2015-11-18 DIAGNOSIS — J3081 Allergic rhinitis due to animal (cat) (dog) hair and dander: Secondary | ICD-10-CM | POA: Diagnosis not present

## 2015-11-18 DIAGNOSIS — K219 Gastro-esophageal reflux disease without esophagitis: Secondary | ICD-10-CM | POA: Diagnosis not present

## 2015-11-18 DIAGNOSIS — F329 Major depressive disorder, single episode, unspecified: Secondary | ICD-10-CM | POA: Diagnosis not present

## 2015-11-18 DIAGNOSIS — Z1211 Encounter for screening for malignant neoplasm of colon: Secondary | ICD-10-CM | POA: Insufficient documentation

## 2015-11-18 DIAGNOSIS — J301 Allergic rhinitis due to pollen: Secondary | ICD-10-CM | POA: Diagnosis not present

## 2015-11-18 DIAGNOSIS — Z86010 Personal history of colonic polyps: Secondary | ICD-10-CM | POA: Diagnosis not present

## 2015-11-18 DIAGNOSIS — J3089 Other allergic rhinitis: Secondary | ICD-10-CM | POA: Diagnosis not present

## 2015-11-18 DIAGNOSIS — Z79899 Other long term (current) drug therapy: Secondary | ICD-10-CM | POA: Insufficient documentation

## 2015-11-18 DIAGNOSIS — M1990 Unspecified osteoarthritis, unspecified site: Secondary | ICD-10-CM | POA: Diagnosis not present

## 2015-11-18 DIAGNOSIS — I10 Essential (primary) hypertension: Secondary | ICD-10-CM | POA: Diagnosis not present

## 2015-11-18 DIAGNOSIS — Z87891 Personal history of nicotine dependence: Secondary | ICD-10-CM | POA: Insufficient documentation

## 2015-11-18 HISTORY — DX: Gastro-esophageal reflux disease without esophagitis: K21.9

## 2015-11-18 HISTORY — PX: COLONOSCOPY WITH PROPOFOL: SHX5780

## 2015-11-18 SURGERY — COLONOSCOPY WITH PROPOFOL
Anesthesia: Monitor Anesthesia Care

## 2015-11-18 MED ORDER — SODIUM CHLORIDE 0.9 % IV SOLN: INTRAVENOUS | Status: DC

## 2015-11-18 MED ORDER — SODIUM CHLORIDE 0.9 % IJ SOLN
INTRAMUSCULAR | Status: AC
  Filled 2015-11-18: qty 10

## 2015-11-18 MED ORDER — LACTATED RINGERS IV SOLN
INTRAVENOUS | Status: DC
  Administered 2015-11-18: 1000 mL via INTRAVENOUS

## 2015-11-18 MED ORDER — PROPOFOL 10 MG/ML IV BOLUS
INTRAVENOUS | Status: AC
  Filled 2015-11-18: qty 20

## 2015-11-18 MED ORDER — EPHEDRINE SULFATE 50 MG/ML IJ SOLN
INTRAMUSCULAR | Status: AC
Start: 2015-11-18 — End: 2015-11-18
  Filled 2015-11-18: qty 1

## 2015-11-18 MED ORDER — LIDOCAINE HCL (CARDIAC) 20 MG/ML IV SOLN
INTRAVENOUS | Status: AC
  Filled 2015-11-18: qty 5

## 2015-11-18 MED ORDER — LIDOCAINE HCL (CARDIAC) 20 MG/ML IV SOLN
INTRAVENOUS | Status: DC | PRN
  Administered 2015-11-18: 100 mg via INTRAVENOUS

## 2015-11-18 MED ORDER — PROPOFOL 10 MG/ML IV BOLUS
INTRAVENOUS | Status: DC | PRN
  Administered 2015-11-18 (×6): 20 mg via INTRAVENOUS
  Administered 2015-11-18: 10 mg via INTRAVENOUS

## 2015-11-18 MED ORDER — PROPOFOL 500 MG/50ML IV EMUL
INTRAVENOUS | Status: DC | PRN
  Administered 2015-11-18: 120 ug/kg/min via INTRAVENOUS

## 2015-11-18 MED ORDER — PROPOFOL 10 MG/ML IV BOLUS
INTRAVENOUS | Status: AC
  Filled 2015-11-18: qty 40

## 2015-11-18 SURGICAL SUPPLY — 22 items

## 2015-11-18 NOTE — H&P (Signed)
  Procedure: Surveillance colonoscopy. 03/06/2010 Baseline screening colonoscopy was performed with removal of three small tubular adenomatous and hyperplastic colon polyps  History: The patient is a 61 year old female born [birth year]. She is scheduled to undergo a surveillance colonoscopy today.  Past medical history: Depression. Hypertension. Ragweed allergy. Wrist surgery. Right breast biopsy.  Medication allergies: Metoprolol causes diarrhea  Exam: The patient is alert and lying comfortably on the endoscopy stretcher. Abdomen is soft and nontender to palpation. Lungs are clear to auscultation. Cardiac exam reveals a regular rhythm.  Plan: Proceed with surveillance colonoscopy

## 2015-11-18 NOTE — Discharge Instructions (Signed)

## 2015-11-18 NOTE — Anesthesia Postprocedure Evaluation (Signed)
Anesthesia Post Note  Patient: Tiffany Curtis  Procedure(s) Performed: Procedure(s) (LRB): COLONOSCOPY WITH PROPOFOL (N/A)  Patient location during evaluation: Endoscopy Anesthesia Type: MAC Level of consciousness: awake Pain management: pain level controlled Vital Signs Assessment: post-procedure vital signs reviewed and stable Respiratory status: spontaneous breathing Cardiovascular status: stable Anesthetic complications: no    Last Vitals:  Vitals:   11/18/15 0830 11/18/15 0840  BP: 112/60 125/70  Pulse:  (!) 50  Resp:    Temp:      Last Pain:  Vitals:   11/18/15 0814  TempSrc: Oral                 EDWARDS,Crissy Mccreadie

## 2015-11-18 NOTE — Op Note (Signed)
[facility] Patient Name: Tiffany Curtis Procedure Date: 11/18/2015 MRN: [id number] Attending MD: Garlan Fair , MD Date of Birth: June 17, 1955 CSN: [id number] Age: 60 Admit Type: Outpatient Procedure:                Colonoscopy Indications:              High risk colon cancer surveillance: Personal                            history of adenoma less than 10 mm in size Providers:                Garlan Fair, MD, Laverta Baltimore RN, RN, Alfonso Patten, Technician, Courtney Heys. Armistead, CRNA Referring MD:              Medicines:                Propofol per Anesthesia Complications:            No immediate complications. Estimated Blood Loss:     Estimated blood loss: none. Procedure:                Pre-Anesthesia Assessment:                           - Prior to the procedure, a History and Physical                            was performed, and patient medications and                            allergies were reviewed. The patient's tolerance of                            previous anesthesia was also reviewed. The risks                            and benefits of the procedure and the sedation                            options and risks were discussed with the patient.                            All questions were answered, and informed consent                            was obtained. Prior Anticoagulants: The patient has                            taken no previous anticoagulant or antiplatelet                            agents. ASA Grade Assessment: II - A patient with  mild systemic disease. After reviewing the risks                            and benefits, the patient was deemed in                            satisfactory condition to undergo the procedure.                           After obtaining informed consent, the colonoscope                            was passed under direct vision. Throughout the             procedure, the patient's blood pressure, pulse, and                            oxygen saturations were monitored continuously. The                            [id number] scope was introduced through                            the anus and advanced to the the cecum, identified                            by appendiceal orifice and ileocecal valve. The                            colonoscopy was somewhat difficult due to                            significant looping. The patient tolerated the                            procedure well. The quality of the bowel                            preparation was good. The appendiceal orifice and                            the rectum were photographed. Scope In: 7:39:40 AM Scope Out: 8:07:53 AM Scope Withdrawal Time: 0 hours 11 minutes 7 seconds  Total Procedure Duration: 0 hours 28 minutes 13 seconds  Findings:      The perianal and digital rectal examinations were normal.      The entire examined colon appeared normal. Impression:               - The entire examined colon is normal.                           - No specimens collected. Moderate Sedation:      N/A- Per Anesthesia Care Recommendation:           - Patient has a contact number available for  emergencies. The signs and symptoms of potential                            delayed complications were discussed with the                            patient. Return to normal activities tomorrow.                            Written discharge instructions were provided to the                            patient.                           - Repeat colonoscopy in 5 years for surveillance.                           - Resume previous diet.                           - Continue present medications. Procedure Code(s):        --- Professional ---                           [id number], Colorectal cancer screening; colonoscopy on                            individual at high  risk Diagnosis Code(s):        --- Professional ---                           Z86.010, Personal history of colonic polyps CPT copyright 2016 American Medical Association. All rights reserved. The codes documented in this report are preliminary and upon coder review may  be revised to meet current compliance requirements. Earle Gell, MD Garlan Fair, MD 11/18/2015 8:15:14 AM This report has been signed electronically. Number of Addenda: 0

## 2015-11-18 NOTE — Transfer of Care (Signed)
Immediate Anesthesia Transfer of Care Note  Patient: Tiffany Curtis  Procedure(s) Performed: Procedure(s): COLONOSCOPY WITH PROPOFOL (N/A)  Patient Location: PACU  Anesthesia Type:MAC  Level of Consciousness: awake, alert , oriented and patient cooperative  Airway & Oxygen Therapy: Patient Spontanous Breathing and Patient connected to face mask oxygen  Post-op Assessment: Report given to RN, Post -op Vital signs reviewed and stable and Patient moving all extremities  Post vital signs: Reviewed and stable  Last Vitals:  Vitals:   11/18/15 0655  BP: (!) 153/78  Pulse: (!) 51  Resp: 10  Temp: 36.7 C    Last Pain:  Vitals:   11/18/15 0655  TempSrc: Oral         Complications: No apparent anesthesia complications

## 2015-11-18 NOTE — Anesthesia Preprocedure Evaluation (Addendum)
Anesthesia Evaluation  Patient identified by MRN, date of birth, ID band Patient awake    Reviewed: Allergy & Precautions, NPO status , Patient's Chart, lab work & pertinent test results  Airway Mallampati: II  TM Distance: >3 FB     Dental   Pulmonary neg pulmonary ROS, former smoker,  History noted. CE   breath sounds clear to auscultation       Cardiovascular hypertension,  Rhythm:Regular Rate:Normal     Neuro/Psych    GI/Hepatic Neg liver ROS, GERD  ,  Endo/Other  negative endocrine ROS  Renal/GU negative Renal ROS     Musculoskeletal  (+) Arthritis ,   Abdominal   Peds  Hematology   Anesthesia Other Findings   Reproductive/Obstetrics                            Anesthesia Physical Anesthesia Plan  ASA: III  Anesthesia Plan: MAC   Post-op Pain Management:    Induction: Intravenous  Airway Management Planned: Simple Face Mask  Additional Equipment:   Intra-op Plan:   Post-operative Plan:   Informed Consent:   Dental advisory given  Plan Discussed with: CRNA and Anesthesiologist  Anesthesia Plan Comments:         Anesthesia Quick Evaluation

## 2015-11-20 ENCOUNTER — Encounter (HOSPITAL_COMMUNITY): Payer: Self-pay | Admitting: Gastroenterology

## 2015-11-29 MED FILL — VYVANSE 40 MG CAPSULE: 40 | 90 days supply | Qty: 90 | Fill #0

## 2015-12-11 DIAGNOSIS — J3089 Other allergic rhinitis: Secondary | ICD-10-CM | POA: Diagnosis not present

## 2015-12-11 DIAGNOSIS — J3081 Allergic rhinitis due to animal (cat) (dog) hair and dander: Secondary | ICD-10-CM | POA: Diagnosis not present

## 2015-12-11 DIAGNOSIS — J301 Allergic rhinitis due to pollen: Secondary | ICD-10-CM | POA: Diagnosis not present

## 2015-12-11 MED FILL — DESVENLAFAXINE SUC ER 50 MG: 50 | 90 days supply | Qty: 90 | Fill #1

## 2015-12-31 DIAGNOSIS — J3081 Allergic rhinitis due to animal (cat) (dog) hair and dander: Secondary | ICD-10-CM | POA: Diagnosis not present

## 2015-12-31 DIAGNOSIS — J3089 Other allergic rhinitis: Secondary | ICD-10-CM | POA: Diagnosis not present

## 2015-12-31 DIAGNOSIS — J301 Allergic rhinitis due to pollen: Secondary | ICD-10-CM | POA: Diagnosis not present

## 2016-01-03 DIAGNOSIS — J301 Allergic rhinitis due to pollen: Secondary | ICD-10-CM | POA: Diagnosis not present

## 2016-01-03 DIAGNOSIS — J3089 Other allergic rhinitis: Secondary | ICD-10-CM | POA: Diagnosis not present

## 2016-01-06 DIAGNOSIS — F605 Obsessive-compulsive personality disorder: Secondary | ICD-10-CM | POA: Diagnosis not present

## 2016-01-06 DIAGNOSIS — F331 Major depressive disorder, recurrent, moderate: Secondary | ICD-10-CM | POA: Diagnosis not present

## 2016-01-07 DIAGNOSIS — J3089 Other allergic rhinitis: Secondary | ICD-10-CM | POA: Diagnosis not present

## 2016-01-07 DIAGNOSIS — J301 Allergic rhinitis due to pollen: Secondary | ICD-10-CM | POA: Diagnosis not present

## 2016-01-07 DIAGNOSIS — J3081 Allergic rhinitis due to animal (cat) (dog) hair and dander: Secondary | ICD-10-CM | POA: Diagnosis not present

## 2016-01-09 MED FILL — LOSARTAN POTASSIUM 50 MG TA: 50 | 90 days supply | Qty: 90 | Fill #1

## 2016-01-21 DIAGNOSIS — J3089 Other allergic rhinitis: Secondary | ICD-10-CM | POA: Diagnosis not present

## 2016-01-21 DIAGNOSIS — J301 Allergic rhinitis due to pollen: Secondary | ICD-10-CM | POA: Diagnosis not present

## 2016-01-21 DIAGNOSIS — J3081 Allergic rhinitis due to animal (cat) (dog) hair and dander: Secondary | ICD-10-CM | POA: Diagnosis not present

## 2016-01-28 DIAGNOSIS — J3089 Other allergic rhinitis: Secondary | ICD-10-CM | POA: Diagnosis not present

## 2016-01-28 DIAGNOSIS — J301 Allergic rhinitis due to pollen: Secondary | ICD-10-CM | POA: Diagnosis not present

## 2016-01-28 DIAGNOSIS — J3081 Allergic rhinitis due to animal (cat) (dog) hair and dander: Secondary | ICD-10-CM | POA: Diagnosis not present

## 2016-02-26 DIAGNOSIS — J3081 Allergic rhinitis due to animal (cat) (dog) hair and dander: Secondary | ICD-10-CM | POA: Diagnosis not present

## 2016-02-26 DIAGNOSIS — J3089 Other allergic rhinitis: Secondary | ICD-10-CM | POA: Diagnosis not present

## 2016-02-26 DIAGNOSIS — J301 Allergic rhinitis due to pollen: Secondary | ICD-10-CM | POA: Diagnosis not present

## 2016-02-28 DIAGNOSIS — J301 Allergic rhinitis due to pollen: Secondary | ICD-10-CM | POA: Diagnosis not present

## 2016-02-28 DIAGNOSIS — J3081 Allergic rhinitis due to animal (cat) (dog) hair and dander: Secondary | ICD-10-CM | POA: Diagnosis not present

## 2016-02-28 DIAGNOSIS — J3089 Other allergic rhinitis: Secondary | ICD-10-CM | POA: Diagnosis not present

## 2016-03-02 MED FILL — VYVANSE 40 MG CAPSULE: 40 | 90 days supply | Qty: 90 | Fill #0

## 2016-03-12 MED FILL — DESVENLAFAXINE SUC ER 50 MG: 50 | 90 days supply | Qty: 90 | Fill #2

## 2016-03-20 DIAGNOSIS — S61512A Laceration without foreign body of left wrist, initial encounter: Secondary | ICD-10-CM | POA: Diagnosis not present

## 2016-04-01 MED FILL — CHLORHEXIDINE 0.12% RINSE: 0.12 | 16 days supply | Qty: 473 | Fill #0

## 2016-04-01 MED FILL — IBUPROFEN 800 MG TABLET: 800 | 5 days supply | Qty: 20 | Fill #0

## 2016-04-01 MED FILL — ACETAMINOPHEN/COD #3 TABLET: 300-30 | 3 days supply | Qty: 12 | Fill #0

## 2016-04-01 MED FILL — AMOXICILLIN 500 MG CAPSULE: 500 | 7 days supply | Qty: 21 | Fill #0

## 2016-04-09 MED FILL — LOSARTAN POTASSIUM 50 MG TA: 50 | 90 days supply | Qty: 90 | Fill #2

## 2016-04-14 DIAGNOSIS — F331 Major depressive disorder, recurrent, moderate: Secondary | ICD-10-CM | POA: Diagnosis not present

## 2016-04-14 DIAGNOSIS — F605 Obsessive-compulsive personality disorder: Secondary | ICD-10-CM | POA: Diagnosis not present

## 2016-04-17 MED FILL — CHLORHEXIDINE 0.12% RINSE: 0.12 | 16 days supply | Qty: 473 | Fill #1

## 2016-05-13 MED FILL — CHLORHEXIDINE 0.12% RINSE: 0.12 | 16 days supply | Qty: 473 | Fill #2

## 2016-05-26 DIAGNOSIS — J3089 Other allergic rhinitis: Secondary | ICD-10-CM | POA: Diagnosis not present

## 2016-05-26 DIAGNOSIS — J3081 Allergic rhinitis due to animal (cat) (dog) hair and dander: Secondary | ICD-10-CM | POA: Diagnosis not present

## 2016-05-26 DIAGNOSIS — J301 Allergic rhinitis due to pollen: Secondary | ICD-10-CM | POA: Diagnosis not present

## 2016-06-04 MED FILL — VYVANSE 40 MG CAPSULE: 40 | 90 days supply | Qty: 90 | Fill #0

## 2016-06-12 DIAGNOSIS — J3089 Other allergic rhinitis: Secondary | ICD-10-CM | POA: Diagnosis not present

## 2016-06-12 DIAGNOSIS — J301 Allergic rhinitis due to pollen: Secondary | ICD-10-CM | POA: Diagnosis not present

## 2016-06-12 DIAGNOSIS — J3081 Allergic rhinitis due to animal (cat) (dog) hair and dander: Secondary | ICD-10-CM | POA: Diagnosis not present

## 2016-06-12 MED FILL — DESVENLAFAXINE SUC ER 50 MG: 50 | 90 days supply | Qty: 90 | Fill #0

## 2016-06-18 DIAGNOSIS — J3081 Allergic rhinitis due to animal (cat) (dog) hair and dander: Secondary | ICD-10-CM | POA: Diagnosis not present

## 2016-06-18 DIAGNOSIS — J301 Allergic rhinitis due to pollen: Secondary | ICD-10-CM | POA: Diagnosis not present

## 2016-06-18 DIAGNOSIS — J3089 Other allergic rhinitis: Secondary | ICD-10-CM | POA: Diagnosis not present

## 2016-06-26 DIAGNOSIS — J3081 Allergic rhinitis due to animal (cat) (dog) hair and dander: Secondary | ICD-10-CM | POA: Diagnosis not present

## 2016-06-26 DIAGNOSIS — J3089 Other allergic rhinitis: Secondary | ICD-10-CM | POA: Diagnosis not present

## 2016-06-26 DIAGNOSIS — J301 Allergic rhinitis due to pollen: Secondary | ICD-10-CM | POA: Diagnosis not present

## 2016-06-26 MED FILL — EPINEPHRINE 0.3 MG AUTO-INJ: 0.3 | 30 days supply | Qty: 2 | Fill #0

## 2016-06-30 DIAGNOSIS — J3081 Allergic rhinitis due to animal (cat) (dog) hair and dander: Secondary | ICD-10-CM | POA: Diagnosis not present

## 2016-06-30 DIAGNOSIS — J3089 Other allergic rhinitis: Secondary | ICD-10-CM | POA: Diagnosis not present

## 2016-06-30 DIAGNOSIS — J301 Allergic rhinitis due to pollen: Secondary | ICD-10-CM | POA: Diagnosis not present

## 2016-06-30 MED FILL — AZELASTINE HCL 0.05% DROPS: 0.05 | 60 days supply | Qty: 6 | Fill #0

## 2016-06-30 MED FILL — hydrOXYzine HCL 25 MG TABS: 25 | 30 days supply | Qty: 90 | Fill #0

## 2016-07-01 DIAGNOSIS — D229 Melanocytic nevi, unspecified: Secondary | ICD-10-CM | POA: Diagnosis not present

## 2016-07-01 DIAGNOSIS — L570 Actinic keratosis: Secondary | ICD-10-CM | POA: Diagnosis not present

## 2016-07-01 DIAGNOSIS — Z85828 Personal history of other malignant neoplasm of skin: Secondary | ICD-10-CM | POA: Diagnosis not present

## 2016-07-03 DIAGNOSIS — J3089 Other allergic rhinitis: Secondary | ICD-10-CM | POA: Diagnosis not present

## 2016-07-03 DIAGNOSIS — J3081 Allergic rhinitis due to animal (cat) (dog) hair and dander: Secondary | ICD-10-CM | POA: Diagnosis not present

## 2016-07-03 DIAGNOSIS — J301 Allergic rhinitis due to pollen: Secondary | ICD-10-CM | POA: Diagnosis not present

## 2016-07-08 DIAGNOSIS — J301 Allergic rhinitis due to pollen: Secondary | ICD-10-CM | POA: Diagnosis not present

## 2016-07-08 DIAGNOSIS — J3081 Allergic rhinitis due to animal (cat) (dog) hair and dander: Secondary | ICD-10-CM | POA: Diagnosis not present

## 2016-07-08 DIAGNOSIS — J3089 Other allergic rhinitis: Secondary | ICD-10-CM | POA: Diagnosis not present

## 2016-07-10 DIAGNOSIS — J3081 Allergic rhinitis due to animal (cat) (dog) hair and dander: Secondary | ICD-10-CM | POA: Diagnosis not present

## 2016-07-10 DIAGNOSIS — J301 Allergic rhinitis due to pollen: Secondary | ICD-10-CM | POA: Diagnosis not present

## 2016-07-10 DIAGNOSIS — J3089 Other allergic rhinitis: Secondary | ICD-10-CM | POA: Diagnosis not present

## 2016-07-10 MED FILL — LEVOCETIRIZINE 5 MG TABLET: 5 | 30 days supply | Qty: 30 | Fill #0

## 2016-07-13 MED FILL — LOSARTAN POTASSIUM 50 MG TA: 50 | 90 days supply | Qty: 90 | Fill #3

## 2016-07-15 DIAGNOSIS — J301 Allergic rhinitis due to pollen: Secondary | ICD-10-CM | POA: Diagnosis not present

## 2016-07-15 DIAGNOSIS — J3081 Allergic rhinitis due to animal (cat) (dog) hair and dander: Secondary | ICD-10-CM | POA: Diagnosis not present

## 2016-07-15 DIAGNOSIS — J3089 Other allergic rhinitis: Secondary | ICD-10-CM | POA: Diagnosis not present

## 2016-07-20 DIAGNOSIS — J3089 Other allergic rhinitis: Secondary | ICD-10-CM | POA: Diagnosis not present

## 2016-07-20 DIAGNOSIS — J3081 Allergic rhinitis due to animal (cat) (dog) hair and dander: Secondary | ICD-10-CM | POA: Diagnosis not present

## 2016-07-20 DIAGNOSIS — J301 Allergic rhinitis due to pollen: Secondary | ICD-10-CM | POA: Diagnosis not present

## 2016-07-28 DIAGNOSIS — J3081 Allergic rhinitis due to animal (cat) (dog) hair and dander: Secondary | ICD-10-CM | POA: Diagnosis not present

## 2016-07-28 DIAGNOSIS — J3089 Other allergic rhinitis: Secondary | ICD-10-CM | POA: Diagnosis not present

## 2016-07-28 DIAGNOSIS — J301 Allergic rhinitis due to pollen: Secondary | ICD-10-CM | POA: Diagnosis not present

## 2016-07-31 DIAGNOSIS — J3089 Other allergic rhinitis: Secondary | ICD-10-CM | POA: Diagnosis not present

## 2016-07-31 DIAGNOSIS — J301 Allergic rhinitis due to pollen: Secondary | ICD-10-CM | POA: Diagnosis not present

## 2016-07-31 DIAGNOSIS — J3081 Allergic rhinitis due to animal (cat) (dog) hair and dander: Secondary | ICD-10-CM | POA: Diagnosis not present

## 2016-08-06 DIAGNOSIS — J301 Allergic rhinitis due to pollen: Secondary | ICD-10-CM | POA: Diagnosis not present

## 2016-08-06 DIAGNOSIS — J3089 Other allergic rhinitis: Secondary | ICD-10-CM | POA: Diagnosis not present

## 2016-08-11 DIAGNOSIS — J3089 Other allergic rhinitis: Secondary | ICD-10-CM | POA: Diagnosis not present

## 2016-08-11 DIAGNOSIS — J301 Allergic rhinitis due to pollen: Secondary | ICD-10-CM | POA: Diagnosis not present

## 2016-08-11 DIAGNOSIS — J3081 Allergic rhinitis due to animal (cat) (dog) hair and dander: Secondary | ICD-10-CM | POA: Diagnosis not present

## 2016-08-13 DIAGNOSIS — J301 Allergic rhinitis due to pollen: Secondary | ICD-10-CM | POA: Diagnosis not present

## 2016-08-13 DIAGNOSIS — J3089 Other allergic rhinitis: Secondary | ICD-10-CM | POA: Diagnosis not present

## 2016-08-13 DIAGNOSIS — J3081 Allergic rhinitis due to animal (cat) (dog) hair and dander: Secondary | ICD-10-CM | POA: Diagnosis not present

## 2016-08-18 DIAGNOSIS — J3089 Other allergic rhinitis: Secondary | ICD-10-CM | POA: Diagnosis not present

## 2016-08-18 DIAGNOSIS — J3081 Allergic rhinitis due to animal (cat) (dog) hair and dander: Secondary | ICD-10-CM | POA: Diagnosis not present

## 2016-08-18 DIAGNOSIS — J301 Allergic rhinitis due to pollen: Secondary | ICD-10-CM | POA: Diagnosis not present

## 2016-08-23 MED FILL — LEVOCETIRIZINE 5 MG TABLET: 5 | 30 days supply | Qty: 30 | Fill #1

## 2016-08-26 DIAGNOSIS — J3081 Allergic rhinitis due to animal (cat) (dog) hair and dander: Secondary | ICD-10-CM | POA: Diagnosis not present

## 2016-08-26 DIAGNOSIS — J301 Allergic rhinitis due to pollen: Secondary | ICD-10-CM | POA: Diagnosis not present

## 2016-08-26 DIAGNOSIS — J3089 Other allergic rhinitis: Secondary | ICD-10-CM | POA: Diagnosis not present

## 2016-08-28 DIAGNOSIS — J3089 Other allergic rhinitis: Secondary | ICD-10-CM | POA: Diagnosis not present

## 2016-08-28 DIAGNOSIS — J301 Allergic rhinitis due to pollen: Secondary | ICD-10-CM | POA: Diagnosis not present

## 2016-08-28 DIAGNOSIS — J3081 Allergic rhinitis due to animal (cat) (dog) hair and dander: Secondary | ICD-10-CM | POA: Diagnosis not present

## 2016-09-02 DIAGNOSIS — J3089 Other allergic rhinitis: Secondary | ICD-10-CM | POA: Diagnosis not present

## 2016-09-02 DIAGNOSIS — J3081 Allergic rhinitis due to animal (cat) (dog) hair and dander: Secondary | ICD-10-CM | POA: Diagnosis not present

## 2016-09-02 DIAGNOSIS — J301 Allergic rhinitis due to pollen: Secondary | ICD-10-CM | POA: Diagnosis not present

## 2016-09-04 DIAGNOSIS — J301 Allergic rhinitis due to pollen: Secondary | ICD-10-CM | POA: Diagnosis not present

## 2016-09-04 DIAGNOSIS — J3081 Allergic rhinitis due to animal (cat) (dog) hair and dander: Secondary | ICD-10-CM | POA: Diagnosis not present

## 2016-09-04 DIAGNOSIS — J3089 Other allergic rhinitis: Secondary | ICD-10-CM | POA: Diagnosis not present

## 2016-09-08 DIAGNOSIS — J301 Allergic rhinitis due to pollen: Secondary | ICD-10-CM | POA: Diagnosis not present

## 2016-09-08 DIAGNOSIS — J3089 Other allergic rhinitis: Secondary | ICD-10-CM | POA: Diagnosis not present

## 2016-09-08 DIAGNOSIS — J3081 Allergic rhinitis due to animal (cat) (dog) hair and dander: Secondary | ICD-10-CM | POA: Diagnosis not present

## 2016-09-08 MED FILL — DESVENLAFAXINE SUC ER 50 MG: 50 | 90 days supply | Qty: 90 | Fill #0

## 2016-09-08 MED FILL — VYVANSE 40 MG CAPSULE: 40 | 30 days supply | Qty: 30 | Fill #0

## 2016-09-10 DIAGNOSIS — J3081 Allergic rhinitis due to animal (cat) (dog) hair and dander: Secondary | ICD-10-CM | POA: Diagnosis not present

## 2016-09-10 DIAGNOSIS — J301 Allergic rhinitis due to pollen: Secondary | ICD-10-CM | POA: Diagnosis not present

## 2016-09-10 DIAGNOSIS — J3089 Other allergic rhinitis: Secondary | ICD-10-CM | POA: Diagnosis not present

## 2016-09-17 DIAGNOSIS — J301 Allergic rhinitis due to pollen: Secondary | ICD-10-CM | POA: Diagnosis not present

## 2016-09-17 DIAGNOSIS — J3089 Other allergic rhinitis: Secondary | ICD-10-CM | POA: Diagnosis not present

## 2016-09-17 DIAGNOSIS — J3081 Allergic rhinitis due to animal (cat) (dog) hair and dander: Secondary | ICD-10-CM | POA: Diagnosis not present

## 2016-09-22 DIAGNOSIS — J3081 Allergic rhinitis due to animal (cat) (dog) hair and dander: Secondary | ICD-10-CM | POA: Diagnosis not present

## 2016-09-22 DIAGNOSIS — J3089 Other allergic rhinitis: Secondary | ICD-10-CM | POA: Diagnosis not present

## 2016-09-22 DIAGNOSIS — J301 Allergic rhinitis due to pollen: Secondary | ICD-10-CM | POA: Diagnosis not present

## 2016-09-25 DIAGNOSIS — J3089 Other allergic rhinitis: Secondary | ICD-10-CM | POA: Diagnosis not present

## 2016-09-25 DIAGNOSIS — J3081 Allergic rhinitis due to animal (cat) (dog) hair and dander: Secondary | ICD-10-CM | POA: Diagnosis not present

## 2016-10-13 DIAGNOSIS — J3081 Allergic rhinitis due to animal (cat) (dog) hair and dander: Secondary | ICD-10-CM | POA: Diagnosis not present

## 2016-10-13 DIAGNOSIS — J3089 Other allergic rhinitis: Secondary | ICD-10-CM | POA: Diagnosis not present

## 2016-10-13 DIAGNOSIS — J301 Allergic rhinitis due to pollen: Secondary | ICD-10-CM | POA: Diagnosis not present

## 2016-10-13 MED FILL — VYVANSE 40 MG CAPSULE: 40 | 30 days supply | Qty: 30 | Fill #0

## 2016-10-14 DIAGNOSIS — F909 Attention-deficit hyperactivity disorder, unspecified type: Secondary | ICD-10-CM | POA: Diagnosis not present

## 2016-10-14 DIAGNOSIS — K222 Esophageal obstruction: Secondary | ICD-10-CM | POA: Diagnosis not present

## 2016-10-14 DIAGNOSIS — G4700 Insomnia, unspecified: Secondary | ICD-10-CM | POA: Diagnosis not present

## 2016-10-14 DIAGNOSIS — Z1211 Encounter for screening for malignant neoplasm of colon: Secondary | ICD-10-CM | POA: Diagnosis not present

## 2016-10-14 DIAGNOSIS — Z79899 Other long term (current) drug therapy: Secondary | ICD-10-CM | POA: Diagnosis not present

## 2016-10-14 DIAGNOSIS — Z0000 Encounter for general adult medical examination without abnormal findings: Secondary | ICD-10-CM | POA: Diagnosis not present

## 2016-10-14 DIAGNOSIS — I10 Essential (primary) hypertension: Secondary | ICD-10-CM | POA: Diagnosis not present

## 2016-10-14 DIAGNOSIS — H9190 Unspecified hearing loss, unspecified ear: Secondary | ICD-10-CM | POA: Diagnosis not present

## 2016-10-14 DIAGNOSIS — Z86010 Personal history of colonic polyps: Secondary | ICD-10-CM | POA: Diagnosis not present

## 2016-10-14 DIAGNOSIS — F329 Major depressive disorder, single episode, unspecified: Secondary | ICD-10-CM | POA: Diagnosis not present

## 2016-10-14 DIAGNOSIS — E785 Hyperlipidemia, unspecified: Secondary | ICD-10-CM | POA: Diagnosis not present

## 2016-10-19 MED FILL — LOSARTAN POTASSIUM 50 MG TA: 50 | 30 days supply | Qty: 30 | Fill #0

## 2016-10-20 DIAGNOSIS — F331 Major depressive disorder, recurrent, moderate: Secondary | ICD-10-CM | POA: Diagnosis not present

## 2016-10-20 DIAGNOSIS — F605 Obsessive-compulsive personality disorder: Secondary | ICD-10-CM | POA: Diagnosis not present

## 2016-10-26 MED FILL — ACETAMINOPHEN/COD #3 TABLET: 300-30 | 2 days supply | Qty: 10 | Fill #0

## 2016-10-26 MED FILL — IBUPROFEN 800 MG TAB: 800 | 5 days supply | Qty: 20 | Fill #0

## 2016-10-26 MED FILL — AMOXICILLIN 500 MG CAPSULE: 500 | 7 days supply | Qty: 21 | Fill #0

## 2016-10-30 DIAGNOSIS — J3089 Other allergic rhinitis: Secondary | ICD-10-CM | POA: Diagnosis not present

## 2016-10-30 DIAGNOSIS — J3081 Allergic rhinitis due to animal (cat) (dog) hair and dander: Secondary | ICD-10-CM | POA: Diagnosis not present

## 2016-10-30 DIAGNOSIS — J301 Allergic rhinitis due to pollen: Secondary | ICD-10-CM | POA: Diagnosis not present

## 2016-11-02 DIAGNOSIS — J301 Allergic rhinitis due to pollen: Secondary | ICD-10-CM | POA: Diagnosis not present

## 2016-11-03 DIAGNOSIS — J3081 Allergic rhinitis due to animal (cat) (dog) hair and dander: Secondary | ICD-10-CM | POA: Diagnosis not present

## 2016-11-03 DIAGNOSIS — J3089 Other allergic rhinitis: Secondary | ICD-10-CM | POA: Diagnosis not present

## 2016-11-10 DIAGNOSIS — J3089 Other allergic rhinitis: Secondary | ICD-10-CM | POA: Diagnosis not present

## 2016-11-10 DIAGNOSIS — L298 Other pruritus: Secondary | ICD-10-CM | POA: Diagnosis not present

## 2016-11-10 DIAGNOSIS — J301 Allergic rhinitis due to pollen: Secondary | ICD-10-CM | POA: Diagnosis not present

## 2016-11-10 DIAGNOSIS — J3081 Allergic rhinitis due to animal (cat) (dog) hair and dander: Secondary | ICD-10-CM | POA: Diagnosis not present

## 2016-11-10 DIAGNOSIS — H1045 Other chronic allergic conjunctivitis: Secondary | ICD-10-CM | POA: Diagnosis not present

## 2016-11-13 MED FILL — VYVANSE 40 MG CAPSULE: 40 | 30 days supply | Qty: 30 | Fill #0

## 2016-11-17 MED FILL — LOSARTAN POTASSIUM 50 MG TA: 50 | 30 days supply | Qty: 30 | Fill #1

## 2016-12-03 DIAGNOSIS — J3081 Allergic rhinitis due to animal (cat) (dog) hair and dander: Secondary | ICD-10-CM | POA: Diagnosis not present

## 2016-12-03 DIAGNOSIS — J301 Allergic rhinitis due to pollen: Secondary | ICD-10-CM | POA: Diagnosis not present

## 2016-12-03 DIAGNOSIS — J3089 Other allergic rhinitis: Secondary | ICD-10-CM | POA: Diagnosis not present

## 2016-12-09 DIAGNOSIS — J301 Allergic rhinitis due to pollen: Secondary | ICD-10-CM | POA: Diagnosis not present

## 2016-12-09 DIAGNOSIS — J3081 Allergic rhinitis due to animal (cat) (dog) hair and dander: Secondary | ICD-10-CM | POA: Diagnosis not present

## 2016-12-09 DIAGNOSIS — J3089 Other allergic rhinitis: Secondary | ICD-10-CM | POA: Diagnosis not present

## 2016-12-10 MED FILL — DESVENLAFAXINE SUC ER 50 MG: 50 | 90 days supply | Qty: 90 | Fill #0

## 2016-12-11 MED FILL — VYVANSE 40 MG CAPSULE: 40 | 30 days supply | Qty: 30 | Fill #0

## 2016-12-18 DIAGNOSIS — J3089 Other allergic rhinitis: Secondary | ICD-10-CM | POA: Diagnosis not present

## 2016-12-18 DIAGNOSIS — J301 Allergic rhinitis due to pollen: Secondary | ICD-10-CM | POA: Diagnosis not present

## 2016-12-18 DIAGNOSIS — J3081 Allergic rhinitis due to animal (cat) (dog) hair and dander: Secondary | ICD-10-CM | POA: Diagnosis not present

## 2016-12-24 DIAGNOSIS — J301 Allergic rhinitis due to pollen: Secondary | ICD-10-CM | POA: Diagnosis not present

## 2016-12-24 DIAGNOSIS — J3089 Other allergic rhinitis: Secondary | ICD-10-CM | POA: Diagnosis not present

## 2016-12-24 DIAGNOSIS — J3081 Allergic rhinitis due to animal (cat) (dog) hair and dander: Secondary | ICD-10-CM | POA: Diagnosis not present

## 2016-12-25 MED FILL — LOSARTAN POTASSIUM 50 MG TA: 50 | 30 days supply | Qty: 30 | Fill #2

## 2016-12-29 DIAGNOSIS — Z1231 Encounter for screening mammogram for malignant neoplasm of breast: Secondary | ICD-10-CM | POA: Diagnosis not present

## 2016-12-31 DIAGNOSIS — J3089 Other allergic rhinitis: Secondary | ICD-10-CM | POA: Diagnosis not present

## 2016-12-31 DIAGNOSIS — J3081 Allergic rhinitis due to animal (cat) (dog) hair and dander: Secondary | ICD-10-CM | POA: Diagnosis not present

## 2016-12-31 DIAGNOSIS — J301 Allergic rhinitis due to pollen: Secondary | ICD-10-CM | POA: Diagnosis not present

## 2017-01-07 DIAGNOSIS — J301 Allergic rhinitis due to pollen: Secondary | ICD-10-CM | POA: Diagnosis not present

## 2017-01-07 DIAGNOSIS — J3089 Other allergic rhinitis: Secondary | ICD-10-CM | POA: Diagnosis not present

## 2017-01-07 DIAGNOSIS — J3081 Allergic rhinitis due to animal (cat) (dog) hair and dander: Secondary | ICD-10-CM | POA: Diagnosis not present

## 2017-01-14 MED FILL — VYVANSE 40 MG CAPSULE: 40 | 30 days supply | Qty: 30 | Fill #0

## 2017-01-19 DIAGNOSIS — F605 Obsessive-compulsive personality disorder: Secondary | ICD-10-CM | POA: Diagnosis not present

## 2017-01-19 DIAGNOSIS — F331 Major depressive disorder, recurrent, moderate: Secondary | ICD-10-CM | POA: Diagnosis not present

## 2017-01-21 MED FILL — LOSARTAN POTASSIUM 50 MG TA: 50 | 30 days supply | Qty: 30 | Fill #3

## 2017-01-29 DIAGNOSIS — J3081 Allergic rhinitis due to animal (cat) (dog) hair and dander: Secondary | ICD-10-CM | POA: Diagnosis not present

## 2017-01-29 DIAGNOSIS — J3089 Other allergic rhinitis: Secondary | ICD-10-CM | POA: Diagnosis not present

## 2017-01-29 DIAGNOSIS — J301 Allergic rhinitis due to pollen: Secondary | ICD-10-CM | POA: Diagnosis not present

## 2017-02-09 DIAGNOSIS — J301 Allergic rhinitis due to pollen: Secondary | ICD-10-CM | POA: Diagnosis not present

## 2017-02-09 DIAGNOSIS — J3081 Allergic rhinitis due to animal (cat) (dog) hair and dander: Secondary | ICD-10-CM | POA: Diagnosis not present

## 2017-02-09 DIAGNOSIS — J3089 Other allergic rhinitis: Secondary | ICD-10-CM | POA: Diagnosis not present

## 2017-02-10 MED FILL — LEVOCETIRIZINE 5 MG TABLET: 5 | 30 days supply | Qty: 30 | Fill #2

## 2017-02-15 MED FILL — VYVANSE 40 MG CAPSULE: 40 | 30 days supply | Qty: 30 | Fill #0

## 2017-02-17 DIAGNOSIS — J3081 Allergic rhinitis due to animal (cat) (dog) hair and dander: Secondary | ICD-10-CM | POA: Diagnosis not present

## 2017-02-17 DIAGNOSIS — J3089 Other allergic rhinitis: Secondary | ICD-10-CM | POA: Diagnosis not present

## 2017-02-17 DIAGNOSIS — J301 Allergic rhinitis due to pollen: Secondary | ICD-10-CM | POA: Diagnosis not present

## 2017-02-25 DIAGNOSIS — M25511 Pain in right shoulder: Secondary | ICD-10-CM | POA: Diagnosis not present

## 2017-02-25 MED FILL — MELOXICAM 7.5 MG TABLET: 7.5 | 30 days supply | Qty: 30 | Fill #0

## 2017-02-26 DIAGNOSIS — J3089 Other allergic rhinitis: Secondary | ICD-10-CM | POA: Diagnosis not present

## 2017-02-26 DIAGNOSIS — J3081 Allergic rhinitis due to animal (cat) (dog) hair and dander: Secondary | ICD-10-CM | POA: Diagnosis not present

## 2017-02-26 DIAGNOSIS — J301 Allergic rhinitis due to pollen: Secondary | ICD-10-CM | POA: Diagnosis not present

## 2017-02-26 MED FILL — LOSARTAN POTASSIUM 50 MG TA: 50 | 90 days supply | Qty: 90 | Fill #4

## 2017-03-02 DIAGNOSIS — M25511 Pain in right shoulder: Secondary | ICD-10-CM | POA: Diagnosis not present

## 2017-03-02 DIAGNOSIS — M7541 Impingement syndrome of right shoulder: Secondary | ICD-10-CM | POA: Diagnosis not present

## 2017-03-11 DIAGNOSIS — J3089 Other allergic rhinitis: Secondary | ICD-10-CM | POA: Diagnosis not present

## 2017-03-11 DIAGNOSIS — J3081 Allergic rhinitis due to animal (cat) (dog) hair and dander: Secondary | ICD-10-CM | POA: Diagnosis not present

## 2017-03-11 DIAGNOSIS — J301 Allergic rhinitis due to pollen: Secondary | ICD-10-CM | POA: Diagnosis not present

## 2017-03-11 MED FILL — DESVENLAFAXINE SUC ER 50 MG: 50 | 90 days supply | Qty: 90 | Fill #1

## 2017-03-19 DIAGNOSIS — J3089 Other allergic rhinitis: Secondary | ICD-10-CM | POA: Diagnosis not present

## 2017-03-19 DIAGNOSIS — J3081 Allergic rhinitis due to animal (cat) (dog) hair and dander: Secondary | ICD-10-CM | POA: Diagnosis not present

## 2017-03-19 DIAGNOSIS — J301 Allergic rhinitis due to pollen: Secondary | ICD-10-CM | POA: Diagnosis not present

## 2017-03-31 MED FILL — VYVANSE 40 MG CAPSULE: 40 | 30 days supply | Qty: 30 | Fill #0

## 2017-04-02 DIAGNOSIS — J3089 Other allergic rhinitis: Secondary | ICD-10-CM | POA: Diagnosis not present

## 2017-04-02 DIAGNOSIS — J3081 Allergic rhinitis due to animal (cat) (dog) hair and dander: Secondary | ICD-10-CM | POA: Diagnosis not present

## 2017-04-02 DIAGNOSIS — J301 Allergic rhinitis due to pollen: Secondary | ICD-10-CM | POA: Diagnosis not present

## 2017-04-06 DIAGNOSIS — M7541 Impingement syndrome of right shoulder: Secondary | ICD-10-CM | POA: Diagnosis not present

## 2017-04-06 DIAGNOSIS — M25511 Pain in right shoulder: Secondary | ICD-10-CM | POA: Diagnosis not present

## 2017-04-20 DIAGNOSIS — J3089 Other allergic rhinitis: Secondary | ICD-10-CM | POA: Diagnosis not present

## 2017-04-20 DIAGNOSIS — J301 Allergic rhinitis due to pollen: Secondary | ICD-10-CM | POA: Diagnosis not present

## 2017-04-20 DIAGNOSIS — J3081 Allergic rhinitis due to animal (cat) (dog) hair and dander: Secondary | ICD-10-CM | POA: Diagnosis not present

## 2017-05-03 MED FILL — VYVANSE 40 MG CAPSULE: 40 | 30 days supply | Qty: 30 | Fill #0

## 2017-05-04 DIAGNOSIS — J301 Allergic rhinitis due to pollen: Secondary | ICD-10-CM | POA: Diagnosis not present

## 2017-05-04 DIAGNOSIS — J3081 Allergic rhinitis due to animal (cat) (dog) hair and dander: Secondary | ICD-10-CM | POA: Diagnosis not present

## 2017-05-04 DIAGNOSIS — J3089 Other allergic rhinitis: Secondary | ICD-10-CM | POA: Diagnosis not present

## 2017-05-14 DIAGNOSIS — J3089 Other allergic rhinitis: Secondary | ICD-10-CM | POA: Diagnosis not present

## 2017-05-14 DIAGNOSIS — J301 Allergic rhinitis due to pollen: Secondary | ICD-10-CM | POA: Diagnosis not present

## 2017-05-14 DIAGNOSIS — J3081 Allergic rhinitis due to animal (cat) (dog) hair and dander: Secondary | ICD-10-CM | POA: Diagnosis not present

## 2017-05-20 DIAGNOSIS — J301 Allergic rhinitis due to pollen: Secondary | ICD-10-CM | POA: Diagnosis not present

## 2017-05-20 DIAGNOSIS — J3089 Other allergic rhinitis: Secondary | ICD-10-CM | POA: Diagnosis not present

## 2017-05-20 DIAGNOSIS — J3081 Allergic rhinitis due to animal (cat) (dog) hair and dander: Secondary | ICD-10-CM | POA: Diagnosis not present

## 2017-06-02 MED FILL — LOSARTAN POTASSIUM 50 MG TA: 50 | 90 days supply | Qty: 90 | Fill #5

## 2017-06-02 MED FILL — VYVANSE 40 MG CAPSULE: 40 | 30 days supply | Qty: 30 | Fill #0

## 2017-06-04 DIAGNOSIS — J3081 Allergic rhinitis due to animal (cat) (dog) hair and dander: Secondary | ICD-10-CM | POA: Diagnosis not present

## 2017-06-04 DIAGNOSIS — J3089 Other allergic rhinitis: Secondary | ICD-10-CM | POA: Diagnosis not present

## 2017-06-04 DIAGNOSIS — J301 Allergic rhinitis due to pollen: Secondary | ICD-10-CM | POA: Diagnosis not present

## 2017-06-10 DIAGNOSIS — J3081 Allergic rhinitis due to animal (cat) (dog) hair and dander: Secondary | ICD-10-CM | POA: Diagnosis not present

## 2017-06-10 DIAGNOSIS — J3089 Other allergic rhinitis: Secondary | ICD-10-CM | POA: Diagnosis not present

## 2017-06-10 DIAGNOSIS — J301 Allergic rhinitis due to pollen: Secondary | ICD-10-CM | POA: Diagnosis not present

## 2017-06-14 MED FILL — DESVENLAFAXINE SUC ER 50 MG: 50 | 90 days supply | Qty: 90 | Fill #0

## 2017-06-25 DIAGNOSIS — J3089 Other allergic rhinitis: Secondary | ICD-10-CM | POA: Diagnosis not present

## 2017-06-25 DIAGNOSIS — J301 Allergic rhinitis due to pollen: Secondary | ICD-10-CM | POA: Diagnosis not present

## 2017-06-25 DIAGNOSIS — J3081 Allergic rhinitis due to animal (cat) (dog) hair and dander: Secondary | ICD-10-CM | POA: Diagnosis not present

## 2017-07-01 DIAGNOSIS — J301 Allergic rhinitis due to pollen: Secondary | ICD-10-CM | POA: Diagnosis not present

## 2017-07-01 DIAGNOSIS — J3089 Other allergic rhinitis: Secondary | ICD-10-CM | POA: Diagnosis not present

## 2017-07-01 DIAGNOSIS — J3081 Allergic rhinitis due to animal (cat) (dog) hair and dander: Secondary | ICD-10-CM | POA: Diagnosis not present

## 2017-07-08 DIAGNOSIS — J301 Allergic rhinitis due to pollen: Secondary | ICD-10-CM | POA: Diagnosis not present

## 2017-07-08 DIAGNOSIS — J3081 Allergic rhinitis due to animal (cat) (dog) hair and dander: Secondary | ICD-10-CM | POA: Diagnosis not present

## 2017-07-08 DIAGNOSIS — J3089 Other allergic rhinitis: Secondary | ICD-10-CM | POA: Diagnosis not present

## 2017-07-13 DIAGNOSIS — F900 Attention-deficit hyperactivity disorder, predominantly inattentive type: Secondary | ICD-10-CM | POA: Diagnosis not present

## 2017-07-13 DIAGNOSIS — F331 Major depressive disorder, recurrent, moderate: Secondary | ICD-10-CM | POA: Diagnosis not present

## 2017-07-13 MED FILL — VYVANSE 40 MG CAPSULE: 40 | 30 days supply | Qty: 30 | Fill #0

## 2017-07-14 DIAGNOSIS — J3089 Other allergic rhinitis: Secondary | ICD-10-CM | POA: Diagnosis not present

## 2017-07-14 DIAGNOSIS — H1045 Other chronic allergic conjunctivitis: Secondary | ICD-10-CM | POA: Diagnosis not present

## 2017-07-14 DIAGNOSIS — J301 Allergic rhinitis due to pollen: Secondary | ICD-10-CM | POA: Diagnosis not present

## 2017-07-14 DIAGNOSIS — L298 Other pruritus: Secondary | ICD-10-CM | POA: Diagnosis not present

## 2017-07-14 DIAGNOSIS — J3081 Allergic rhinitis due to animal (cat) (dog) hair and dander: Secondary | ICD-10-CM | POA: Diagnosis not present

## 2017-07-20 DIAGNOSIS — M25511 Pain in right shoulder: Secondary | ICD-10-CM | POA: Diagnosis not present

## 2017-07-22 DIAGNOSIS — J301 Allergic rhinitis due to pollen: Secondary | ICD-10-CM | POA: Diagnosis not present

## 2017-07-22 DIAGNOSIS — J3081 Allergic rhinitis due to animal (cat) (dog) hair and dander: Secondary | ICD-10-CM | POA: Diagnosis not present

## 2017-07-22 DIAGNOSIS — J3089 Other allergic rhinitis: Secondary | ICD-10-CM | POA: Diagnosis not present

## 2017-07-23 DIAGNOSIS — M25511 Pain in right shoulder: Secondary | ICD-10-CM | POA: Diagnosis not present

## 2017-07-26 DIAGNOSIS — J301 Allergic rhinitis due to pollen: Secondary | ICD-10-CM | POA: Diagnosis not present

## 2017-07-27 DIAGNOSIS — M25511 Pain in right shoulder: Secondary | ICD-10-CM | POA: Diagnosis not present

## 2017-07-27 DIAGNOSIS — J3081 Allergic rhinitis due to animal (cat) (dog) hair and dander: Secondary | ICD-10-CM | POA: Diagnosis not present

## 2017-07-27 DIAGNOSIS — J3089 Other allergic rhinitis: Secondary | ICD-10-CM | POA: Diagnosis not present

## 2017-07-29 DIAGNOSIS — J301 Allergic rhinitis due to pollen: Secondary | ICD-10-CM | POA: Diagnosis not present

## 2017-07-29 DIAGNOSIS — J3081 Allergic rhinitis due to animal (cat) (dog) hair and dander: Secondary | ICD-10-CM | POA: Diagnosis not present

## 2017-07-29 DIAGNOSIS — J3089 Other allergic rhinitis: Secondary | ICD-10-CM | POA: Diagnosis not present

## 2017-07-29 MED FILL — MELOXICAM 7.5 MG TABLET: 7.5 | 30 days supply | Qty: 30 | Fill #0

## 2017-08-02 DIAGNOSIS — M25511 Pain in right shoulder: Secondary | ICD-10-CM | POA: Diagnosis not present

## 2017-08-02 DIAGNOSIS — M25611 Stiffness of right shoulder, not elsewhere classified: Secondary | ICD-10-CM | POA: Diagnosis not present

## 2017-08-02 DIAGNOSIS — S46011D Strain of muscle(s) and tendon(s) of the rotator cuff of right shoulder, subsequent encounter: Secondary | ICD-10-CM | POA: Diagnosis not present

## 2017-08-06 DIAGNOSIS — J3089 Other allergic rhinitis: Secondary | ICD-10-CM | POA: Diagnosis not present

## 2017-08-06 DIAGNOSIS — J301 Allergic rhinitis due to pollen: Secondary | ICD-10-CM | POA: Diagnosis not present

## 2017-08-06 DIAGNOSIS — J3081 Allergic rhinitis due to animal (cat) (dog) hair and dander: Secondary | ICD-10-CM | POA: Diagnosis not present

## 2017-08-10 DIAGNOSIS — S46011D Strain of muscle(s) and tendon(s) of the rotator cuff of right shoulder, subsequent encounter: Secondary | ICD-10-CM | POA: Diagnosis not present

## 2017-08-10 DIAGNOSIS — M25511 Pain in right shoulder: Secondary | ICD-10-CM | POA: Diagnosis not present

## 2017-08-10 DIAGNOSIS — M25611 Stiffness of right shoulder, not elsewhere classified: Secondary | ICD-10-CM | POA: Diagnosis not present

## 2017-08-13 DIAGNOSIS — J301 Allergic rhinitis due to pollen: Secondary | ICD-10-CM | POA: Diagnosis not present

## 2017-08-13 DIAGNOSIS — J3089 Other allergic rhinitis: Secondary | ICD-10-CM | POA: Diagnosis not present

## 2017-08-13 DIAGNOSIS — J3081 Allergic rhinitis due to animal (cat) (dog) hair and dander: Secondary | ICD-10-CM | POA: Diagnosis not present

## 2017-08-13 MED FILL — VYVANSE 40 MG CAPSULE: 40 | 30 days supply | Qty: 30 | Fill #0

## 2017-08-19 DIAGNOSIS — M25511 Pain in right shoulder: Secondary | ICD-10-CM | POA: Diagnosis not present

## 2017-08-19 DIAGNOSIS — M25611 Stiffness of right shoulder, not elsewhere classified: Secondary | ICD-10-CM | POA: Diagnosis not present

## 2017-08-19 DIAGNOSIS — S46011D Strain of muscle(s) and tendon(s) of the rotator cuff of right shoulder, subsequent encounter: Secondary | ICD-10-CM | POA: Diagnosis not present

## 2017-08-20 DIAGNOSIS — J301 Allergic rhinitis due to pollen: Secondary | ICD-10-CM | POA: Diagnosis not present

## 2017-08-20 DIAGNOSIS — J3089 Other allergic rhinitis: Secondary | ICD-10-CM | POA: Diagnosis not present

## 2017-08-20 DIAGNOSIS — J3081 Allergic rhinitis due to animal (cat) (dog) hair and dander: Secondary | ICD-10-CM | POA: Diagnosis not present

## 2017-08-27 DIAGNOSIS — J301 Allergic rhinitis due to pollen: Secondary | ICD-10-CM | POA: Diagnosis not present

## 2017-08-30 DIAGNOSIS — M25511 Pain in right shoulder: Secondary | ICD-10-CM | POA: Diagnosis not present

## 2017-08-30 DIAGNOSIS — M25611 Stiffness of right shoulder, not elsewhere classified: Secondary | ICD-10-CM | POA: Diagnosis not present

## 2017-08-30 DIAGNOSIS — S46011D Strain of muscle(s) and tendon(s) of the rotator cuff of right shoulder, subsequent encounter: Secondary | ICD-10-CM | POA: Diagnosis not present

## 2017-09-02 MED FILL — LOSARTAN POTASSIUM 50 MG TA: 50 | 60 days supply | Qty: 60 | Fill #6

## 2017-09-03 DIAGNOSIS — J3081 Allergic rhinitis due to animal (cat) (dog) hair and dander: Secondary | ICD-10-CM | POA: Diagnosis not present

## 2017-09-03 DIAGNOSIS — J301 Allergic rhinitis due to pollen: Secondary | ICD-10-CM | POA: Diagnosis not present

## 2017-09-03 DIAGNOSIS — J3089 Other allergic rhinitis: Secondary | ICD-10-CM | POA: Diagnosis not present

## 2017-09-07 DIAGNOSIS — M25511 Pain in right shoulder: Secondary | ICD-10-CM | POA: Diagnosis not present

## 2017-09-08 MED FILL — DESVENLAFAXINE SUC ER 50 MG: 50 | 90 days supply | Qty: 90 | Fill #1

## 2017-09-20 MED FILL — VYVANSE 40 MG CAPSULE: 40 | 30 days supply | Qty: 30 | Fill #0

## 2017-10-01 DIAGNOSIS — J301 Allergic rhinitis due to pollen: Secondary | ICD-10-CM | POA: Diagnosis not present

## 2017-10-01 DIAGNOSIS — J3081 Allergic rhinitis due to animal (cat) (dog) hair and dander: Secondary | ICD-10-CM | POA: Diagnosis not present

## 2017-10-01 DIAGNOSIS — J3089 Other allergic rhinitis: Secondary | ICD-10-CM | POA: Diagnosis not present

## 2017-10-07 DIAGNOSIS — J3081 Allergic rhinitis due to animal (cat) (dog) hair and dander: Secondary | ICD-10-CM | POA: Diagnosis not present

## 2017-10-07 DIAGNOSIS — J3089 Other allergic rhinitis: Secondary | ICD-10-CM | POA: Diagnosis not present

## 2017-10-07 DIAGNOSIS — J301 Allergic rhinitis due to pollen: Secondary | ICD-10-CM | POA: Diagnosis not present

## 2017-10-12 DIAGNOSIS — F900 Attention-deficit hyperactivity disorder, predominantly inattentive type: Secondary | ICD-10-CM | POA: Diagnosis not present

## 2017-10-12 DIAGNOSIS — J3089 Other allergic rhinitis: Secondary | ICD-10-CM | POA: Diagnosis not present

## 2017-10-12 DIAGNOSIS — F331 Major depressive disorder, recurrent, moderate: Secondary | ICD-10-CM | POA: Diagnosis not present

## 2017-10-12 DIAGNOSIS — J3081 Allergic rhinitis due to animal (cat) (dog) hair and dander: Secondary | ICD-10-CM | POA: Diagnosis not present

## 2017-10-12 DIAGNOSIS — J301 Allergic rhinitis due to pollen: Secondary | ICD-10-CM | POA: Diagnosis not present

## 2017-10-21 DIAGNOSIS — I10 Essential (primary) hypertension: Secondary | ICD-10-CM | POA: Diagnosis not present

## 2017-10-21 DIAGNOSIS — Z9109 Other allergy status, other than to drugs and biological substances: Secondary | ICD-10-CM | POA: Diagnosis not present

## 2017-10-21 DIAGNOSIS — G4700 Insomnia, unspecified: Secondary | ICD-10-CM | POA: Diagnosis not present

## 2017-10-21 DIAGNOSIS — Z0000 Encounter for general adult medical examination without abnormal findings: Secondary | ICD-10-CM | POA: Diagnosis not present

## 2017-10-21 DIAGNOSIS — K222 Esophageal obstruction: Secondary | ICD-10-CM | POA: Diagnosis not present

## 2017-10-21 DIAGNOSIS — H9190 Unspecified hearing loss, unspecified ear: Secondary | ICD-10-CM | POA: Diagnosis not present

## 2017-10-21 DIAGNOSIS — E785 Hyperlipidemia, unspecified: Secondary | ICD-10-CM | POA: Diagnosis not present

## 2017-10-21 DIAGNOSIS — F909 Attention-deficit hyperactivity disorder, unspecified type: Secondary | ICD-10-CM | POA: Diagnosis not present

## 2017-10-21 DIAGNOSIS — Z79899 Other long term (current) drug therapy: Secondary | ICD-10-CM | POA: Diagnosis not present

## 2017-10-22 DIAGNOSIS — J3081 Allergic rhinitis due to animal (cat) (dog) hair and dander: Secondary | ICD-10-CM | POA: Diagnosis not present

## 2017-10-22 DIAGNOSIS — J301 Allergic rhinitis due to pollen: Secondary | ICD-10-CM | POA: Diagnosis not present

## 2017-10-22 DIAGNOSIS — J3089 Other allergic rhinitis: Secondary | ICD-10-CM | POA: Diagnosis not present

## 2017-10-22 MED FILL — VYVANSE 40 MG CAPSULE: 40 | 30 days supply | Qty: 30 | Fill #0

## 2017-11-04 MED FILL — LOSARTAN POTASSIUM 50 MG TA: 50 | 90 days supply | Qty: 90 | Fill #0

## 2017-11-11 DIAGNOSIS — J301 Allergic rhinitis due to pollen: Secondary | ICD-10-CM | POA: Diagnosis not present

## 2017-11-11 DIAGNOSIS — J3089 Other allergic rhinitis: Secondary | ICD-10-CM | POA: Diagnosis not present

## 2017-11-11 DIAGNOSIS — J3081 Allergic rhinitis due to animal (cat) (dog) hair and dander: Secondary | ICD-10-CM | POA: Diagnosis not present

## 2017-11-15 DIAGNOSIS — J3081 Allergic rhinitis due to animal (cat) (dog) hair and dander: Secondary | ICD-10-CM | POA: Diagnosis not present

## 2017-11-15 DIAGNOSIS — J3089 Other allergic rhinitis: Secondary | ICD-10-CM | POA: Diagnosis not present

## 2017-11-15 DIAGNOSIS — J301 Allergic rhinitis due to pollen: Secondary | ICD-10-CM | POA: Diagnosis not present

## 2017-11-23 MED FILL — VYVANSE 40 MG CAPSULE: 40 | 30 days supply | Qty: 30 | Fill #0

## 2017-11-26 DIAGNOSIS — J3081 Allergic rhinitis due to animal (cat) (dog) hair and dander: Secondary | ICD-10-CM | POA: Diagnosis not present

## 2017-11-26 DIAGNOSIS — J301 Allergic rhinitis due to pollen: Secondary | ICD-10-CM | POA: Diagnosis not present

## 2017-11-26 DIAGNOSIS — J3089 Other allergic rhinitis: Secondary | ICD-10-CM | POA: Diagnosis not present

## 2017-12-03 DIAGNOSIS — J3081 Allergic rhinitis due to animal (cat) (dog) hair and dander: Secondary | ICD-10-CM | POA: Diagnosis not present

## 2017-12-03 DIAGNOSIS — J301 Allergic rhinitis due to pollen: Secondary | ICD-10-CM | POA: Diagnosis not present

## 2017-12-03 DIAGNOSIS — J3089 Other allergic rhinitis: Secondary | ICD-10-CM | POA: Diagnosis not present

## 2017-12-10 DIAGNOSIS — J3089 Other allergic rhinitis: Secondary | ICD-10-CM | POA: Diagnosis not present

## 2017-12-10 DIAGNOSIS — J301 Allergic rhinitis due to pollen: Secondary | ICD-10-CM | POA: Diagnosis not present

## 2017-12-10 DIAGNOSIS — J3081 Allergic rhinitis due to animal (cat) (dog) hair and dander: Secondary | ICD-10-CM | POA: Diagnosis not present

## 2017-12-15 MED FILL — DESVENLAFAXINE SUC ER 50 MG: 50 | 90 days supply | Qty: 90 | Fill #2

## 2017-12-17 DIAGNOSIS — J301 Allergic rhinitis due to pollen: Secondary | ICD-10-CM | POA: Diagnosis not present

## 2017-12-17 DIAGNOSIS — J3081 Allergic rhinitis due to animal (cat) (dog) hair and dander: Secondary | ICD-10-CM | POA: Diagnosis not present

## 2017-12-17 DIAGNOSIS — J3089 Other allergic rhinitis: Secondary | ICD-10-CM | POA: Diagnosis not present

## 2017-12-21 DIAGNOSIS — J3089 Other allergic rhinitis: Secondary | ICD-10-CM | POA: Diagnosis not present

## 2017-12-21 DIAGNOSIS — J3081 Allergic rhinitis due to animal (cat) (dog) hair and dander: Secondary | ICD-10-CM | POA: Diagnosis not present

## 2017-12-21 DIAGNOSIS — J301 Allergic rhinitis due to pollen: Secondary | ICD-10-CM | POA: Diagnosis not present

## 2017-12-24 MED FILL — VYVANSE 40 MG CAPSULE: 40 | 30 days supply | Qty: 30 | Fill #0

## 2017-12-30 DIAGNOSIS — J3089 Other allergic rhinitis: Secondary | ICD-10-CM | POA: Diagnosis not present

## 2017-12-30 DIAGNOSIS — J301 Allergic rhinitis due to pollen: Secondary | ICD-10-CM | POA: Diagnosis not present

## 2017-12-30 DIAGNOSIS — J3081 Allergic rhinitis due to animal (cat) (dog) hair and dander: Secondary | ICD-10-CM | POA: Diagnosis not present

## 2017-12-31 DIAGNOSIS — Z1231 Encounter for screening mammogram for malignant neoplasm of breast: Secondary | ICD-10-CM | POA: Diagnosis not present

## 2018-01-11 DIAGNOSIS — F331 Major depressive disorder, recurrent, moderate: Secondary | ICD-10-CM | POA: Diagnosis not present

## 2018-01-11 DIAGNOSIS — F900 Attention-deficit hyperactivity disorder, predominantly inattentive type: Secondary | ICD-10-CM | POA: Diagnosis not present

## 2018-01-21 DIAGNOSIS — J3081 Allergic rhinitis due to animal (cat) (dog) hair and dander: Secondary | ICD-10-CM | POA: Diagnosis not present

## 2018-01-21 DIAGNOSIS — J3089 Other allergic rhinitis: Secondary | ICD-10-CM | POA: Diagnosis not present

## 2018-01-21 DIAGNOSIS — J301 Allergic rhinitis due to pollen: Secondary | ICD-10-CM | POA: Diagnosis not present

## 2018-01-25 MED FILL — VYVANSE 40 MG CAPSULE: 40 | 30 days supply | Qty: 30 | Fill #0

## 2018-01-27 DIAGNOSIS — J3089 Other allergic rhinitis: Secondary | ICD-10-CM | POA: Diagnosis not present

## 2018-01-27 DIAGNOSIS — J3081 Allergic rhinitis due to animal (cat) (dog) hair and dander: Secondary | ICD-10-CM | POA: Diagnosis not present

## 2018-01-27 DIAGNOSIS — J301 Allergic rhinitis due to pollen: Secondary | ICD-10-CM | POA: Diagnosis not present

## 2018-01-28 DIAGNOSIS — J301 Allergic rhinitis due to pollen: Secondary | ICD-10-CM | POA: Diagnosis not present

## 2018-01-31 DIAGNOSIS — J3081 Allergic rhinitis due to animal (cat) (dog) hair and dander: Secondary | ICD-10-CM | POA: Diagnosis not present

## 2018-01-31 DIAGNOSIS — J3089 Other allergic rhinitis: Secondary | ICD-10-CM | POA: Diagnosis not present

## 2018-02-03 DIAGNOSIS — J301 Allergic rhinitis due to pollen: Secondary | ICD-10-CM | POA: Diagnosis not present

## 2018-02-03 DIAGNOSIS — J3089 Other allergic rhinitis: Secondary | ICD-10-CM | POA: Diagnosis not present

## 2018-02-03 DIAGNOSIS — J3081 Allergic rhinitis due to animal (cat) (dog) hair and dander: Secondary | ICD-10-CM | POA: Diagnosis not present

## 2018-02-04 MED FILL — LOSARTAN POTASSIUM 50 MG TA: 50 | 30 days supply | Qty: 30 | Fill #1

## 2018-02-06 ENCOUNTER — Ambulatory Visit (INDEPENDENT_AMBULATORY_CARE_PROVIDER_SITE_OTHER): Payer: Self-pay | Admitting: Family Medicine

## 2018-02-06 VITALS — BP 122/80 | HR 73 | Temp 98.7°F | Wt 118.2 lb

## 2018-02-06 DIAGNOSIS — R05 Cough: Secondary | ICD-10-CM

## 2018-02-06 DIAGNOSIS — J029 Acute pharyngitis, unspecified: Secondary | ICD-10-CM

## 2018-02-06 DIAGNOSIS — R059 Cough, unspecified: Secondary | ICD-10-CM

## 2018-02-06 LAB — POCT RAPID STREP A (OFFICE): RAPID STREP A SCREEN: NEGATIVE

## 2018-02-06 MED ORDER — BENZONATATE 200 MG PO CAPS
200.0000 mg | ORAL_CAPSULE | Freq: Three times a day (TID) | ORAL | 0 refills | Status: DC | PRN
Start: 2018-02-06 — End: 2021-07-16

## 2018-02-06 MED ORDER — AMOXICILLIN 875 MG PO TABS: 875.0000 mg | ORAL_TABLET | Freq: Two times a day (BID) | ORAL | 0 refills | Status: AC

## 2018-02-06 NOTE — Patient Instructions (Signed)

## 2018-02-06 NOTE — Progress Notes (Signed)
Tiffany Curtis is a 62 y.o. female who presents today with concerns of sore throat intermittently for the last 10 days. 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 She reports that the symptoms seem to improve then returned worse and she began to lose her voice. She has attempted a few other counter supportive care treatments with mild relief. She denies any fever but reports a new onset cough with the loss of her voice. She denies any social, home or work contacts with similar symptoms but does report being poorly dressed [place].  Review of Systems  Constitutional: Negative for chills, fever and malaise/fatigue.  HENT: Positive for sore throat. Negative for congestion, ear discharge, ear pain and sinus pain.   Eyes: Negative.   Respiratory: Positive for cough. Negative for sputum production and shortness of breath.   Cardiovascular: Negative.  Negative for chest pain.  Gastrointestinal: Negative for abdominal pain, diarrhea, nausea and vomiting.  Genitourinary: Negative for dysuria, frequency, hematuria and urgency.  Musculoskeletal: Negative for myalgias.  Skin: Negative.   Neurological: Negative for headaches.  Endo/Heme/Allergies: Negative.   Psychiatric/Behavioral: Negative.     O: Vitals:   02/06/18 1337  BP: 122/80  Pulse: 73  Temp: 98.7 F (37.1 C)  SpO2: 99%     Physical Exam  Constitutional: She is oriented to person, place, and time. Vital signs are normal. She appears well-developed and well-nourished. She is active.  Non-toxic appearance. She does not have a sickly appearance.  HENT:  Head: Normocephalic.  Right Ear: Hearing, tympanic membrane, external ear and ear canal normal.  Left Ear: Hearing, tympanic membrane, external ear and ear canal normal.  Nose: Rhinorrhea present.  Mouth/Throat: Uvula is midline and mucous membranes are normal. Posterior  oropharyngeal erythema present. Tonsils are 1+ on the right. Tonsils are 1+ on the left. No tonsillar exudate.  Neck: Normal range of motion. Neck supple.  Cardiovascular: Normal rate, regular rhythm, normal heart sounds and normal pulses.  Pulmonary/Chest: Effort normal and breath sounds normal.  Abdominal: Soft. Bowel sounds are normal.  Musculoskeletal: Normal range of motion.  Lymphadenopathy:       Head (right side): No submental and no submandibular adenopathy present.       Head (left side): No submental and no submandibular adenopathy present.    She has no cervical adenopathy.  Neurological: She is alert and oriented to person, place, and time.  Psychiatric: She has a normal mood and affect.  Vitals reviewed.   A: 1. Sore throat   2. Pharyngitis, unspecified etiology   3. Cough     P: discussed exam findings, diagnosis etiology and medication use and indications reviewed with patient. Follow- Up and discharge instructions provided. No emergent/urgent issues found on exam.  Patient verbalized understanding of information provided and agrees with plan of care (POC), all questions answered.  1. Pharyngitis, unspecified etiology - amoxicillin (AMOXIL) 875 MG tablet; Take 1 tablet (875 mg total) by mouth 2 (two) times daily for 7 days.   2. Sore throat - POCT rapid strep A Results for orders placed or performed in visit on 02/06/18 (from the past 24 hour(s))  POCT rapid strep A     Status: Normal   Collection Time: 02/06/18  1:38 PM  Result Value Ref Range   Rapid Strep A Screen Negative Negative    3. Cough - benzonatate (TESSALON) 200  MG capsule; Take 1 capsule (200 mg total) by mouth 3 (three) times daily as needed.

## 2018-02-10 DIAGNOSIS — J3081 Allergic rhinitis due to animal (cat) (dog) hair and dander: Secondary | ICD-10-CM | POA: Diagnosis not present

## 2018-02-10 DIAGNOSIS — J301 Allergic rhinitis due to pollen: Secondary | ICD-10-CM | POA: Diagnosis not present

## 2018-02-10 DIAGNOSIS — J3089 Other allergic rhinitis: Secondary | ICD-10-CM | POA: Diagnosis not present

## 2018-02-15 DIAGNOSIS — J301 Allergic rhinitis due to pollen: Secondary | ICD-10-CM | POA: Diagnosis not present

## 2018-02-15 DIAGNOSIS — J3081 Allergic rhinitis due to animal (cat) (dog) hair and dander: Secondary | ICD-10-CM | POA: Diagnosis not present

## 2018-02-15 DIAGNOSIS — J3089 Other allergic rhinitis: Secondary | ICD-10-CM | POA: Diagnosis not present

## 2018-02-25 DIAGNOSIS — J3081 Allergic rhinitis due to animal (cat) (dog) hair and dander: Secondary | ICD-10-CM | POA: Diagnosis not present

## 2018-02-25 DIAGNOSIS — J3089 Other allergic rhinitis: Secondary | ICD-10-CM | POA: Diagnosis not present

## 2018-02-25 DIAGNOSIS — J301 Allergic rhinitis due to pollen: Secondary | ICD-10-CM | POA: Diagnosis not present

## 2018-02-25 MED FILL — VYVANSE 40 MG CAPSULE: 40 | 30 days supply | Qty: 30 | Fill #0

## 2018-03-03 DIAGNOSIS — J3089 Other allergic rhinitis: Secondary | ICD-10-CM | POA: Diagnosis not present

## 2018-03-03 DIAGNOSIS — J3081 Allergic rhinitis due to animal (cat) (dog) hair and dander: Secondary | ICD-10-CM | POA: Diagnosis not present

## 2018-03-03 DIAGNOSIS — J301 Allergic rhinitis due to pollen: Secondary | ICD-10-CM | POA: Diagnosis not present

## 2018-03-07 DIAGNOSIS — H90A21 Sensorineural hearing loss, unilateral, right ear, with restricted hearing on the contralateral side: Secondary | ICD-10-CM | POA: Diagnosis not present

## 2018-03-09 MED FILL — hydrOXYzine HCL 25 MG TABS: 25 | 30 days supply | Qty: 90 | Fill #0

## 2018-03-09 MED FILL — LOSARTAN POTASSIUM 50 MG TA: 50 | 30 days supply | Qty: 30 | Fill #2

## 2018-03-11 DIAGNOSIS — J301 Allergic rhinitis due to pollen: Secondary | ICD-10-CM | POA: Diagnosis not present

## 2018-03-11 DIAGNOSIS — J3089 Other allergic rhinitis: Secondary | ICD-10-CM | POA: Diagnosis not present

## 2018-03-11 DIAGNOSIS — J3081 Allergic rhinitis due to animal (cat) (dog) hair and dander: Secondary | ICD-10-CM | POA: Diagnosis not present

## 2018-03-18 DIAGNOSIS — H903 Sensorineural hearing loss, bilateral: Secondary | ICD-10-CM | POA: Diagnosis not present

## 2018-03-21 MED FILL — DESVENLAFAXINE SUC ER 50 MG: 50 | 90 days supply | Qty: 90 | Fill #0

## 2018-03-25 DIAGNOSIS — J3089 Other allergic rhinitis: Secondary | ICD-10-CM | POA: Diagnosis not present

## 2018-03-25 DIAGNOSIS — J3081 Allergic rhinitis due to animal (cat) (dog) hair and dander: Secondary | ICD-10-CM | POA: Diagnosis not present

## 2018-03-25 DIAGNOSIS — J301 Allergic rhinitis due to pollen: Secondary | ICD-10-CM | POA: Diagnosis not present

## 2018-04-05 MED FILL — VYVANSE 40 MG CAPSULE: 40 | 30 days supply | Qty: 30 | Fill #0

## 2018-04-07 DIAGNOSIS — J3081 Allergic rhinitis due to animal (cat) (dog) hair and dander: Secondary | ICD-10-CM | POA: Diagnosis not present

## 2018-04-07 DIAGNOSIS — J301 Allergic rhinitis due to pollen: Secondary | ICD-10-CM | POA: Diagnosis not present

## 2018-04-07 DIAGNOSIS — J3089 Other allergic rhinitis: Secondary | ICD-10-CM | POA: Diagnosis not present

## 2018-04-08 MED FILL — LOSARTAN POTASSIUM 50 MG TA: 50 | 30 days supply | Qty: 30 | Fill #3

## 2018-04-21 MED FILL — EPINEPHRINE 0.3 MG AUTO-INJ: 0.3 | 30 days supply | Qty: 2 | Fill #0

## 2018-04-27 DIAGNOSIS — J3089 Other allergic rhinitis: Secondary | ICD-10-CM | POA: Diagnosis not present

## 2018-04-27 DIAGNOSIS — J3081 Allergic rhinitis due to animal (cat) (dog) hair and dander: Secondary | ICD-10-CM | POA: Diagnosis not present

## 2018-04-27 DIAGNOSIS — J301 Allergic rhinitis due to pollen: Secondary | ICD-10-CM | POA: Diagnosis not present

## 2018-04-29 DIAGNOSIS — J301 Allergic rhinitis due to pollen: Secondary | ICD-10-CM | POA: Diagnosis not present

## 2018-04-29 DIAGNOSIS — J3089 Other allergic rhinitis: Secondary | ICD-10-CM | POA: Diagnosis not present

## 2018-04-29 DIAGNOSIS — J3081 Allergic rhinitis due to animal (cat) (dog) hair and dander: Secondary | ICD-10-CM | POA: Diagnosis not present

## 2018-05-03 MED FILL — VYVANSE 40 MG CAPSULE: 40 | 30 days supply | Qty: 30 | Fill #0

## 2018-05-05 DIAGNOSIS — F331 Major depressive disorder, recurrent, moderate: Secondary | ICD-10-CM | POA: Diagnosis not present

## 2018-05-05 DIAGNOSIS — F900 Attention-deficit hyperactivity disorder, predominantly inattentive type: Secondary | ICD-10-CM | POA: Diagnosis not present

## 2018-05-05 MED FILL — LOSARTAN POTASSIUM 50 MG TA: 50 | 30 days supply | Qty: 30 | Fill #4

## 2018-05-06 DIAGNOSIS — J3081 Allergic rhinitis due to animal (cat) (dog) hair and dander: Secondary | ICD-10-CM | POA: Diagnosis not present

## 2018-05-06 DIAGNOSIS — J301 Allergic rhinitis due to pollen: Secondary | ICD-10-CM | POA: Diagnosis not present

## 2018-05-06 DIAGNOSIS — J3089 Other allergic rhinitis: Secondary | ICD-10-CM | POA: Diagnosis not present

## 2018-05-12 DIAGNOSIS — J301 Allergic rhinitis due to pollen: Secondary | ICD-10-CM | POA: Diagnosis not present

## 2018-05-12 DIAGNOSIS — J3089 Other allergic rhinitis: Secondary | ICD-10-CM | POA: Diagnosis not present

## 2018-05-12 DIAGNOSIS — J3081 Allergic rhinitis due to animal (cat) (dog) hair and dander: Secondary | ICD-10-CM | POA: Diagnosis not present

## 2018-05-19 DIAGNOSIS — J301 Allergic rhinitis due to pollen: Secondary | ICD-10-CM | POA: Diagnosis not present

## 2018-05-19 DIAGNOSIS — J3089 Other allergic rhinitis: Secondary | ICD-10-CM | POA: Diagnosis not present

## 2018-05-19 DIAGNOSIS — J3081 Allergic rhinitis due to animal (cat) (dog) hair and dander: Secondary | ICD-10-CM | POA: Diagnosis not present

## 2018-05-24 DIAGNOSIS — J3089 Other allergic rhinitis: Secondary | ICD-10-CM | POA: Diagnosis not present

## 2018-05-24 DIAGNOSIS — J3081 Allergic rhinitis due to animal (cat) (dog) hair and dander: Secondary | ICD-10-CM | POA: Diagnosis not present

## 2018-05-24 DIAGNOSIS — J301 Allergic rhinitis due to pollen: Secondary | ICD-10-CM | POA: Diagnosis not present

## 2018-05-27 DIAGNOSIS — J3081 Allergic rhinitis due to animal (cat) (dog) hair and dander: Secondary | ICD-10-CM | POA: Diagnosis not present

## 2018-05-27 DIAGNOSIS — J3089 Other allergic rhinitis: Secondary | ICD-10-CM | POA: Diagnosis not present

## 2018-05-27 DIAGNOSIS — J301 Allergic rhinitis due to pollen: Secondary | ICD-10-CM | POA: Diagnosis not present

## 2018-06-02 MED FILL — VYVANSE 40 MG CAPSULE: 40 | 30 days supply | Qty: 30 | Fill #0

## 2018-06-02 MED FILL — hydrOXYzine HCL 25 MG TABS: 25 | 90 days supply | Qty: 180 | Fill #0

## 2018-06-02 MED FILL — LOSARTAN POTASSIUM 50 MG TA: 50 | 30 days supply | Qty: 30 | Fill #5

## 2018-06-03 DIAGNOSIS — J301 Allergic rhinitis due to pollen: Secondary | ICD-10-CM | POA: Diagnosis not present

## 2018-06-03 DIAGNOSIS — J3089 Other allergic rhinitis: Secondary | ICD-10-CM | POA: Diagnosis not present

## 2018-06-03 DIAGNOSIS — J3081 Allergic rhinitis due to animal (cat) (dog) hair and dander: Secondary | ICD-10-CM | POA: Diagnosis not present

## 2018-06-10 DIAGNOSIS — J301 Allergic rhinitis due to pollen: Secondary | ICD-10-CM | POA: Diagnosis not present

## 2018-06-10 DIAGNOSIS — J3089 Other allergic rhinitis: Secondary | ICD-10-CM | POA: Diagnosis not present

## 2018-06-10 DIAGNOSIS — J3081 Allergic rhinitis due to animal (cat) (dog) hair and dander: Secondary | ICD-10-CM | POA: Diagnosis not present

## 2018-06-13 MED FILL — DESVENLAFAXINE SUC ER 50 MG: 50 | 90 days supply | Qty: 90 | Fill #1

## 2018-06-17 DIAGNOSIS — J3081 Allergic rhinitis due to animal (cat) (dog) hair and dander: Secondary | ICD-10-CM | POA: Diagnosis not present

## 2018-06-17 DIAGNOSIS — J3089 Other allergic rhinitis: Secondary | ICD-10-CM | POA: Diagnosis not present

## 2018-06-17 DIAGNOSIS — J301 Allergic rhinitis due to pollen: Secondary | ICD-10-CM | POA: Diagnosis not present

## 2018-06-24 DIAGNOSIS — J3081 Allergic rhinitis due to animal (cat) (dog) hair and dander: Secondary | ICD-10-CM | POA: Diagnosis not present

## 2018-06-24 DIAGNOSIS — J3089 Other allergic rhinitis: Secondary | ICD-10-CM | POA: Diagnosis not present

## 2018-06-24 DIAGNOSIS — J301 Allergic rhinitis due to pollen: Secondary | ICD-10-CM | POA: Diagnosis not present

## 2018-06-30 DIAGNOSIS — J3089 Other allergic rhinitis: Secondary | ICD-10-CM | POA: Diagnosis not present

## 2018-06-30 DIAGNOSIS — J3081 Allergic rhinitis due to animal (cat) (dog) hair and dander: Secondary | ICD-10-CM | POA: Diagnosis not present

## 2018-06-30 DIAGNOSIS — J301 Allergic rhinitis due to pollen: Secondary | ICD-10-CM | POA: Diagnosis not present

## 2018-07-04 MED FILL — LOSARTAN POTASSIUM 50 MG TA: 50 | 30 days supply | Qty: 30 | Fill #6

## 2018-07-06 DIAGNOSIS — J301 Allergic rhinitis due to pollen: Secondary | ICD-10-CM | POA: Diagnosis not present

## 2018-07-06 DIAGNOSIS — J3089 Other allergic rhinitis: Secondary | ICD-10-CM | POA: Diagnosis not present

## 2018-07-06 DIAGNOSIS — H1045 Other chronic allergic conjunctivitis: Secondary | ICD-10-CM | POA: Diagnosis not present

## 2018-07-06 DIAGNOSIS — L298 Other pruritus: Secondary | ICD-10-CM | POA: Diagnosis not present

## 2018-07-06 MED FILL — LEVOCETIRIZINE 5 MG TABLET: 5 | 30 days supply | Qty: 30 | Fill #0

## 2018-07-06 MED FILL — VYVANSE 40 MG CAPSULE: 40 | 30 days supply | Qty: 30 | Fill #0

## 2018-07-08 DIAGNOSIS — J3089 Other allergic rhinitis: Secondary | ICD-10-CM | POA: Diagnosis not present

## 2018-07-08 DIAGNOSIS — J301 Allergic rhinitis due to pollen: Secondary | ICD-10-CM | POA: Diagnosis not present

## 2018-07-08 DIAGNOSIS — J3081 Allergic rhinitis due to animal (cat) (dog) hair and dander: Secondary | ICD-10-CM | POA: Diagnosis not present

## 2018-07-14 DIAGNOSIS — J3089 Other allergic rhinitis: Secondary | ICD-10-CM | POA: Diagnosis not present

## 2018-07-14 DIAGNOSIS — J301 Allergic rhinitis due to pollen: Secondary | ICD-10-CM | POA: Diagnosis not present

## 2018-07-14 DIAGNOSIS — J3081 Allergic rhinitis due to animal (cat) (dog) hair and dander: Secondary | ICD-10-CM | POA: Diagnosis not present

## 2018-07-22 DIAGNOSIS — J301 Allergic rhinitis due to pollen: Secondary | ICD-10-CM | POA: Diagnosis not present

## 2018-07-22 DIAGNOSIS — J3081 Allergic rhinitis due to animal (cat) (dog) hair and dander: Secondary | ICD-10-CM | POA: Diagnosis not present

## 2018-07-22 DIAGNOSIS — J3089 Other allergic rhinitis: Secondary | ICD-10-CM | POA: Diagnosis not present

## 2018-07-29 DIAGNOSIS — J3081 Allergic rhinitis due to animal (cat) (dog) hair and dander: Secondary | ICD-10-CM | POA: Diagnosis not present

## 2018-07-29 DIAGNOSIS — J3089 Other allergic rhinitis: Secondary | ICD-10-CM | POA: Diagnosis not present

## 2018-07-29 DIAGNOSIS — J301 Allergic rhinitis due to pollen: Secondary | ICD-10-CM | POA: Diagnosis not present

## 2018-08-03 MED FILL — LOSARTAN POTASSIUM 50 MG TA: 50 | 30 days supply | Qty: 30 | Fill #7

## 2018-08-05 DIAGNOSIS — J3081 Allergic rhinitis due to animal (cat) (dog) hair and dander: Secondary | ICD-10-CM | POA: Diagnosis not present

## 2018-08-05 DIAGNOSIS — J3089 Other allergic rhinitis: Secondary | ICD-10-CM | POA: Diagnosis not present

## 2018-08-05 DIAGNOSIS — J301 Allergic rhinitis due to pollen: Secondary | ICD-10-CM | POA: Diagnosis not present

## 2018-08-11 DIAGNOSIS — J3089 Other allergic rhinitis: Secondary | ICD-10-CM | POA: Diagnosis not present

## 2018-08-11 DIAGNOSIS — J3081 Allergic rhinitis due to animal (cat) (dog) hair and dander: Secondary | ICD-10-CM | POA: Diagnosis not present

## 2018-08-11 DIAGNOSIS — J301 Allergic rhinitis due to pollen: Secondary | ICD-10-CM | POA: Diagnosis not present

## 2018-08-11 MED FILL — VYVANSE 40 MG CAPSULE: 40 | 30 days supply | Qty: 30 | Fill #0

## 2018-08-19 DIAGNOSIS — J301 Allergic rhinitis due to pollen: Secondary | ICD-10-CM | POA: Diagnosis not present

## 2018-08-19 DIAGNOSIS — J3081 Allergic rhinitis due to animal (cat) (dog) hair and dander: Secondary | ICD-10-CM | POA: Diagnosis not present

## 2018-08-19 DIAGNOSIS — J3089 Other allergic rhinitis: Secondary | ICD-10-CM | POA: Diagnosis not present

## 2018-08-24 DIAGNOSIS — J301 Allergic rhinitis due to pollen: Secondary | ICD-10-CM | POA: Diagnosis not present

## 2018-08-25 DIAGNOSIS — J3089 Other allergic rhinitis: Secondary | ICD-10-CM | POA: Diagnosis not present

## 2018-08-25 DIAGNOSIS — J3081 Allergic rhinitis due to animal (cat) (dog) hair and dander: Secondary | ICD-10-CM | POA: Diagnosis not present

## 2018-08-25 DIAGNOSIS — J301 Allergic rhinitis due to pollen: Secondary | ICD-10-CM | POA: Diagnosis not present

## 2018-08-30 DIAGNOSIS — H524 Presbyopia: Secondary | ICD-10-CM | POA: Diagnosis not present

## 2018-08-30 DIAGNOSIS — H52221 Regular astigmatism, right eye: Secondary | ICD-10-CM | POA: Diagnosis not present

## 2018-08-30 DIAGNOSIS — H5203 Hypermetropia, bilateral: Secondary | ICD-10-CM | POA: Diagnosis not present

## 2018-08-30 DIAGNOSIS — H2513 Age-related nuclear cataract, bilateral: Secondary | ICD-10-CM | POA: Diagnosis not present

## 2018-09-01 MED FILL — LEVOCETIRIZINE 5 MG TABLET: 5 | 30 days supply | Qty: 30 | Fill #1

## 2018-09-01 MED FILL — hydrOXYzine HCL 25 MG TABS: 25 | 90 days supply | Qty: 180 | Fill #1

## 2018-09-01 MED FILL — LOSARTAN POTASSIUM 50 MG TA: 50 | 30 days supply | Qty: 30 | Fill #8

## 2018-09-02 DIAGNOSIS — J3081 Allergic rhinitis due to animal (cat) (dog) hair and dander: Secondary | ICD-10-CM | POA: Diagnosis not present

## 2018-09-02 DIAGNOSIS — J3089 Other allergic rhinitis: Secondary | ICD-10-CM | POA: Diagnosis not present

## 2018-09-02 DIAGNOSIS — J301 Allergic rhinitis due to pollen: Secondary | ICD-10-CM | POA: Diagnosis not present

## 2018-09-09 DIAGNOSIS — J3089 Other allergic rhinitis: Secondary | ICD-10-CM | POA: Diagnosis not present

## 2018-09-09 DIAGNOSIS — J3081 Allergic rhinitis due to animal (cat) (dog) hair and dander: Secondary | ICD-10-CM | POA: Diagnosis not present

## 2018-09-09 DIAGNOSIS — J301 Allergic rhinitis due to pollen: Secondary | ICD-10-CM | POA: Diagnosis not present

## 2018-09-15 MED FILL — VYVANSE 40 MG CAPSULE: 40 | 30 days supply | Qty: 30 | Fill #0

## 2018-09-15 MED FILL — DESVENLAFAXINE SUC ER 50 MG: 50 | 90 days supply | Qty: 90 | Fill #0

## 2018-09-16 DIAGNOSIS — J3089 Other allergic rhinitis: Secondary | ICD-10-CM | POA: Diagnosis not present

## 2018-09-16 DIAGNOSIS — J3081 Allergic rhinitis due to animal (cat) (dog) hair and dander: Secondary | ICD-10-CM | POA: Diagnosis not present

## 2018-09-16 DIAGNOSIS — J301 Allergic rhinitis due to pollen: Secondary | ICD-10-CM | POA: Diagnosis not present

## 2018-09-20 DIAGNOSIS — J3081 Allergic rhinitis due to animal (cat) (dog) hair and dander: Secondary | ICD-10-CM | POA: Diagnosis not present

## 2018-09-20 DIAGNOSIS — J301 Allergic rhinitis due to pollen: Secondary | ICD-10-CM | POA: Diagnosis not present

## 2018-09-20 DIAGNOSIS — J3089 Other allergic rhinitis: Secondary | ICD-10-CM | POA: Diagnosis not present

## 2018-09-22 DIAGNOSIS — J3089 Other allergic rhinitis: Secondary | ICD-10-CM | POA: Diagnosis not present

## 2018-09-22 DIAGNOSIS — J301 Allergic rhinitis due to pollen: Secondary | ICD-10-CM | POA: Diagnosis not present

## 2018-09-22 DIAGNOSIS — J3081 Allergic rhinitis due to animal (cat) (dog) hair and dander: Secondary | ICD-10-CM | POA: Diagnosis not present

## 2018-09-28 DIAGNOSIS — J3081 Allergic rhinitis due to animal (cat) (dog) hair and dander: Secondary | ICD-10-CM | POA: Diagnosis not present

## 2018-09-28 DIAGNOSIS — J301 Allergic rhinitis due to pollen: Secondary | ICD-10-CM | POA: Diagnosis not present

## 2018-09-28 DIAGNOSIS — J3089 Other allergic rhinitis: Secondary | ICD-10-CM | POA: Diagnosis not present

## 2018-10-06 MED FILL — LOSARTAN POTASSIUM 50 MG TA: 50 | 30 days supply | Qty: 30 | Fill #9

## 2018-10-12 DIAGNOSIS — J3081 Allergic rhinitis due to animal (cat) (dog) hair and dander: Secondary | ICD-10-CM | POA: Diagnosis not present

## 2018-10-12 DIAGNOSIS — J3089 Other allergic rhinitis: Secondary | ICD-10-CM | POA: Diagnosis not present

## 2018-10-12 DIAGNOSIS — J301 Allergic rhinitis due to pollen: Secondary | ICD-10-CM | POA: Diagnosis not present

## 2018-10-17 MED FILL — VYVANSE 40 MG CAPSULE: 40 | 30 days supply | Qty: 30 | Fill #0

## 2018-10-18 DIAGNOSIS — J3089 Other allergic rhinitis: Secondary | ICD-10-CM | POA: Diagnosis not present

## 2018-10-18 DIAGNOSIS — J301 Allergic rhinitis due to pollen: Secondary | ICD-10-CM | POA: Diagnosis not present

## 2018-10-18 DIAGNOSIS — J3081 Allergic rhinitis due to animal (cat) (dog) hair and dander: Secondary | ICD-10-CM | POA: Diagnosis not present

## 2018-10-28 DIAGNOSIS — J301 Allergic rhinitis due to pollen: Secondary | ICD-10-CM | POA: Diagnosis not present

## 2018-10-28 DIAGNOSIS — J3089 Other allergic rhinitis: Secondary | ICD-10-CM | POA: Diagnosis not present

## 2018-10-28 DIAGNOSIS — J3081 Allergic rhinitis due to animal (cat) (dog) hair and dander: Secondary | ICD-10-CM | POA: Diagnosis not present

## 2018-11-04 DIAGNOSIS — J3089 Other allergic rhinitis: Secondary | ICD-10-CM | POA: Diagnosis not present

## 2018-11-04 DIAGNOSIS — J301 Allergic rhinitis due to pollen: Secondary | ICD-10-CM | POA: Diagnosis not present

## 2018-11-04 DIAGNOSIS — J3081 Allergic rhinitis due to animal (cat) (dog) hair and dander: Secondary | ICD-10-CM | POA: Diagnosis not present

## 2018-11-07 MED FILL — LOSARTAN POTASSIUM 50 MG TA: 50 | 30 days supply | Qty: 30 | Fill #0

## 2018-11-07 MED FILL — HYDROXYZINE PAMOATE 25 MG C: 25 | 90 days supply | Qty: 180 | Fill #0

## 2018-11-07 MED FILL — DESVENLAFAXINE SUC ER 50 MG: 50 | 90 days supply | Qty: 90 | Fill #0

## 2018-11-17 DIAGNOSIS — Z9109 Other allergy status, other than to drugs and biological substances: Secondary | ICD-10-CM | POA: Diagnosis not present

## 2018-11-17 DIAGNOSIS — H9190 Unspecified hearing loss, unspecified ear: Secondary | ICD-10-CM | POA: Diagnosis not present

## 2018-11-17 DIAGNOSIS — Z0001 Encounter for general adult medical examination with abnormal findings: Secondary | ICD-10-CM | POA: Diagnosis not present

## 2018-11-17 DIAGNOSIS — Z23 Encounter for immunization: Secondary | ICD-10-CM | POA: Diagnosis not present

## 2018-11-17 DIAGNOSIS — I10 Essential (primary) hypertension: Secondary | ICD-10-CM | POA: Diagnosis not present

## 2018-11-17 DIAGNOSIS — F909 Attention-deficit hyperactivity disorder, unspecified type: Secondary | ICD-10-CM | POA: Diagnosis not present

## 2018-11-17 DIAGNOSIS — E785 Hyperlipidemia, unspecified: Secondary | ICD-10-CM | POA: Diagnosis not present

## 2018-11-17 DIAGNOSIS — K222 Esophageal obstruction: Secondary | ICD-10-CM | POA: Diagnosis not present

## 2018-11-17 DIAGNOSIS — F324 Major depressive disorder, single episode, in partial remission: Secondary | ICD-10-CM | POA: Diagnosis not present

## 2018-11-17 DIAGNOSIS — Z79899 Other long term (current) drug therapy: Secondary | ICD-10-CM | POA: Diagnosis not present

## 2018-11-17 DIAGNOSIS — G4700 Insomnia, unspecified: Secondary | ICD-10-CM | POA: Diagnosis not present

## 2018-11-18 DIAGNOSIS — J3081 Allergic rhinitis due to animal (cat) (dog) hair and dander: Secondary | ICD-10-CM | POA: Diagnosis not present

## 2018-11-18 DIAGNOSIS — J301 Allergic rhinitis due to pollen: Secondary | ICD-10-CM | POA: Diagnosis not present

## 2018-11-18 DIAGNOSIS — J3089 Other allergic rhinitis: Secondary | ICD-10-CM | POA: Diagnosis not present

## 2018-11-18 MED FILL — VYVANSE 40 MG CAPSULE: 40 | 30 days supply | Qty: 30 | Fill #0

## 2018-11-25 DIAGNOSIS — J3081 Allergic rhinitis due to animal (cat) (dog) hair and dander: Secondary | ICD-10-CM | POA: Diagnosis not present

## 2018-11-25 DIAGNOSIS — J301 Allergic rhinitis due to pollen: Secondary | ICD-10-CM | POA: Diagnosis not present

## 2018-11-25 DIAGNOSIS — J3089 Other allergic rhinitis: Secondary | ICD-10-CM | POA: Diagnosis not present

## 2018-12-05 DIAGNOSIS — J3081 Allergic rhinitis due to animal (cat) (dog) hair and dander: Secondary | ICD-10-CM | POA: Diagnosis not present

## 2018-12-05 DIAGNOSIS — J3089 Other allergic rhinitis: Secondary | ICD-10-CM | POA: Diagnosis not present

## 2018-12-05 DIAGNOSIS — J301 Allergic rhinitis due to pollen: Secondary | ICD-10-CM | POA: Diagnosis not present

## 2018-12-08 MED FILL — LOSARTAN POTASSIUM 50 MG TA: 50 | 30 days supply | Qty: 30 | Fill #1

## 2018-12-15 DIAGNOSIS — J301 Allergic rhinitis due to pollen: Secondary | ICD-10-CM | POA: Diagnosis not present

## 2018-12-15 DIAGNOSIS — J3081 Allergic rhinitis due to animal (cat) (dog) hair and dander: Secondary | ICD-10-CM | POA: Diagnosis not present

## 2018-12-15 DIAGNOSIS — J3089 Other allergic rhinitis: Secondary | ICD-10-CM | POA: Diagnosis not present

## 2018-12-23 MED FILL — VYVANSE 40 MG CAPSULE: 40 | 30 days supply | Qty: 30 | Fill #0

## 2018-12-27 DIAGNOSIS — J3081 Allergic rhinitis due to animal (cat) (dog) hair and dander: Secondary | ICD-10-CM | POA: Diagnosis not present

## 2018-12-27 DIAGNOSIS — J3089 Other allergic rhinitis: Secondary | ICD-10-CM | POA: Diagnosis not present

## 2018-12-27 DIAGNOSIS — J301 Allergic rhinitis due to pollen: Secondary | ICD-10-CM | POA: Diagnosis not present

## 2019-01-06 DIAGNOSIS — J3089 Other allergic rhinitis: Secondary | ICD-10-CM | POA: Diagnosis not present

## 2019-01-06 DIAGNOSIS — J3081 Allergic rhinitis due to animal (cat) (dog) hair and dander: Secondary | ICD-10-CM | POA: Diagnosis not present

## 2019-01-06 DIAGNOSIS — Z1231 Encounter for screening mammogram for malignant neoplasm of breast: Secondary | ICD-10-CM | POA: Diagnosis not present

## 2019-01-06 DIAGNOSIS — J301 Allergic rhinitis due to pollen: Secondary | ICD-10-CM | POA: Diagnosis not present

## 2019-01-09 MED FILL — LOSARTAN POTASSIUM 50 MG TA: 50 | 30 days supply | Qty: 30 | Fill #2

## 2019-01-13 DIAGNOSIS — J3089 Other allergic rhinitis: Secondary | ICD-10-CM | POA: Diagnosis not present

## 2019-01-13 DIAGNOSIS — J301 Allergic rhinitis due to pollen: Secondary | ICD-10-CM | POA: Diagnosis not present

## 2019-01-13 DIAGNOSIS — J3081 Allergic rhinitis due to animal (cat) (dog) hair and dander: Secondary | ICD-10-CM | POA: Diagnosis not present

## 2019-01-19 DIAGNOSIS — S8001XA Contusion of right knee, initial encounter: Secondary | ICD-10-CM | POA: Diagnosis not present

## 2019-01-19 MED FILL — CEPHALEXIN 500 MG CAPSULE: 500 | 10 days supply | Qty: 40 | Fill #0

## 2019-01-25 DIAGNOSIS — J3089 Other allergic rhinitis: Secondary | ICD-10-CM | POA: Diagnosis not present

## 2019-01-25 DIAGNOSIS — J301 Allergic rhinitis due to pollen: Secondary | ICD-10-CM | POA: Diagnosis not present

## 2019-01-25 DIAGNOSIS — J3081 Allergic rhinitis due to animal (cat) (dog) hair and dander: Secondary | ICD-10-CM | POA: Diagnosis not present

## 2019-01-26 DIAGNOSIS — S8001XD Contusion of right knee, subsequent encounter: Secondary | ICD-10-CM | POA: Diagnosis not present

## 2019-01-30 DIAGNOSIS — M25561 Pain in right knee: Secondary | ICD-10-CM | POA: Diagnosis not present

## 2019-02-02 DIAGNOSIS — S82144A Nondisplaced bicondylar fracture of right tibia, initial encounter for closed fracture: Secondary | ICD-10-CM | POA: Diagnosis not present

## 2019-02-09 MED FILL — LOSARTAN POTASSIUM 50 MG TA: 50 | 30 days supply | Qty: 30 | Fill #3

## 2019-02-13 DIAGNOSIS — J301 Allergic rhinitis due to pollen: Secondary | ICD-10-CM | POA: Diagnosis not present

## 2019-02-13 DIAGNOSIS — J3089 Other allergic rhinitis: Secondary | ICD-10-CM | POA: Diagnosis not present

## 2019-02-13 DIAGNOSIS — J3081 Allergic rhinitis due to animal (cat) (dog) hair and dander: Secondary | ICD-10-CM | POA: Diagnosis not present

## 2019-02-20 DIAGNOSIS — S82144D Nondisplaced bicondylar fracture of right tibia, subsequent encounter for closed fracture with routine healing: Secondary | ICD-10-CM | POA: Diagnosis not present

## 2019-03-02 DIAGNOSIS — J3081 Allergic rhinitis due to animal (cat) (dog) hair and dander: Secondary | ICD-10-CM | POA: Diagnosis not present

## 2019-03-02 DIAGNOSIS — J301 Allergic rhinitis due to pollen: Secondary | ICD-10-CM | POA: Diagnosis not present

## 2019-03-02 DIAGNOSIS — J3089 Other allergic rhinitis: Secondary | ICD-10-CM | POA: Diagnosis not present

## 2019-03-06 MED FILL — HYDROXYZINE PAMOATE 25 MG C: 25 | 90 days supply | Qty: 180 | Fill #0

## 2019-03-06 MED FILL — DESVENLAFAXINE SUC ER 50 MG: 50 | 90 days supply | Qty: 90 | Fill #0

## 2019-03-09 DIAGNOSIS — J3089 Other allergic rhinitis: Secondary | ICD-10-CM | POA: Diagnosis not present

## 2019-03-09 DIAGNOSIS — J301 Allergic rhinitis due to pollen: Secondary | ICD-10-CM | POA: Diagnosis not present

## 2019-03-09 DIAGNOSIS — J3081 Allergic rhinitis due to animal (cat) (dog) hair and dander: Secondary | ICD-10-CM | POA: Diagnosis not present

## 2019-03-13 DIAGNOSIS — S82144D Nondisplaced bicondylar fracture of right tibia, subsequent encounter for closed fracture with routine healing: Secondary | ICD-10-CM | POA: Diagnosis not present

## 2019-03-13 MED FILL — LOSARTAN POTASSIUM 50 MG TA: 50 | 30 days supply | Qty: 30 | Fill #4

## 2019-03-28 DIAGNOSIS — S82144D Nondisplaced bicondylar fracture of right tibia, subsequent encounter for closed fracture with routine healing: Secondary | ICD-10-CM | POA: Diagnosis not present

## 2019-03-28 DIAGNOSIS — M25561 Pain in right knee: Secondary | ICD-10-CM | POA: Diagnosis not present

## 2019-03-28 DIAGNOSIS — M25661 Stiffness of right knee, not elsewhere classified: Secondary | ICD-10-CM | POA: Diagnosis not present

## 2019-03-28 DIAGNOSIS — M6281 Muscle weakness (generalized): Secondary | ICD-10-CM | POA: Diagnosis not present

## 2019-03-30 DIAGNOSIS — M6281 Muscle weakness (generalized): Secondary | ICD-10-CM | POA: Diagnosis not present

## 2019-03-30 DIAGNOSIS — M25561 Pain in right knee: Secondary | ICD-10-CM | POA: Diagnosis not present

## 2019-03-30 DIAGNOSIS — M25661 Stiffness of right knee, not elsewhere classified: Secondary | ICD-10-CM | POA: Diagnosis not present

## 2019-03-30 DIAGNOSIS — S82144D Nondisplaced bicondylar fracture of right tibia, subsequent encounter for closed fracture with routine healing: Secondary | ICD-10-CM | POA: Diagnosis not present

## 2019-03-31 DIAGNOSIS — J3081 Allergic rhinitis due to animal (cat) (dog) hair and dander: Secondary | ICD-10-CM | POA: Diagnosis not present

## 2019-03-31 DIAGNOSIS — J3089 Other allergic rhinitis: Secondary | ICD-10-CM | POA: Diagnosis not present

## 2019-03-31 DIAGNOSIS — J301 Allergic rhinitis due to pollen: Secondary | ICD-10-CM | POA: Diagnosis not present

## 2019-04-03 DIAGNOSIS — M25661 Stiffness of right knee, not elsewhere classified: Secondary | ICD-10-CM | POA: Diagnosis not present

## 2019-04-03 DIAGNOSIS — M25561 Pain in right knee: Secondary | ICD-10-CM | POA: Diagnosis not present

## 2019-04-03 DIAGNOSIS — M6281 Muscle weakness (generalized): Secondary | ICD-10-CM | POA: Diagnosis not present

## 2019-04-03 DIAGNOSIS — S82144D Nondisplaced bicondylar fracture of right tibia, subsequent encounter for closed fracture with routine healing: Secondary | ICD-10-CM | POA: Diagnosis not present

## 2019-04-05 DIAGNOSIS — M6281 Muscle weakness (generalized): Secondary | ICD-10-CM | POA: Diagnosis not present

## 2019-04-05 DIAGNOSIS — M25561 Pain in right knee: Secondary | ICD-10-CM | POA: Diagnosis not present

## 2019-04-05 DIAGNOSIS — S82144D Nondisplaced bicondylar fracture of right tibia, subsequent encounter for closed fracture with routine healing: Secondary | ICD-10-CM | POA: Diagnosis not present

## 2019-04-05 DIAGNOSIS — M25661 Stiffness of right knee, not elsewhere classified: Secondary | ICD-10-CM | POA: Diagnosis not present

## 2019-04-07 DIAGNOSIS — J301 Allergic rhinitis due to pollen: Secondary | ICD-10-CM | POA: Diagnosis not present

## 2019-04-07 DIAGNOSIS — J3089 Other allergic rhinitis: Secondary | ICD-10-CM | POA: Diagnosis not present

## 2019-04-07 DIAGNOSIS — J3081 Allergic rhinitis due to animal (cat) (dog) hair and dander: Secondary | ICD-10-CM | POA: Diagnosis not present

## 2019-04-10 DIAGNOSIS — S82144D Nondisplaced bicondylar fracture of right tibia, subsequent encounter for closed fracture with routine healing: Secondary | ICD-10-CM | POA: Diagnosis not present

## 2019-04-11 DIAGNOSIS — M25661 Stiffness of right knee, not elsewhere classified: Secondary | ICD-10-CM | POA: Diagnosis not present

## 2019-04-11 DIAGNOSIS — J3089 Other allergic rhinitis: Secondary | ICD-10-CM | POA: Diagnosis not present

## 2019-04-11 DIAGNOSIS — S82144D Nondisplaced bicondylar fracture of right tibia, subsequent encounter for closed fracture with routine healing: Secondary | ICD-10-CM | POA: Diagnosis not present

## 2019-04-11 DIAGNOSIS — M6281 Muscle weakness (generalized): Secondary | ICD-10-CM | POA: Diagnosis not present

## 2019-04-11 DIAGNOSIS — M25561 Pain in right knee: Secondary | ICD-10-CM | POA: Diagnosis not present

## 2019-04-11 DIAGNOSIS — J301 Allergic rhinitis due to pollen: Secondary | ICD-10-CM | POA: Diagnosis not present

## 2019-04-11 DIAGNOSIS — J3081 Allergic rhinitis due to animal (cat) (dog) hair and dander: Secondary | ICD-10-CM | POA: Diagnosis not present

## 2019-04-13 DIAGNOSIS — M6281 Muscle weakness (generalized): Secondary | ICD-10-CM | POA: Diagnosis not present

## 2019-04-13 DIAGNOSIS — S82144D Nondisplaced bicondylar fracture of right tibia, subsequent encounter for closed fracture with routine healing: Secondary | ICD-10-CM | POA: Diagnosis not present

## 2019-04-13 DIAGNOSIS — M25561 Pain in right knee: Secondary | ICD-10-CM | POA: Diagnosis not present

## 2019-04-13 DIAGNOSIS — M25661 Stiffness of right knee, not elsewhere classified: Secondary | ICD-10-CM | POA: Diagnosis not present

## 2019-04-13 MED FILL — LOSARTAN POTASSIUM 50 MG TA: 50 | 30 days supply | Qty: 30 | Fill #5

## 2019-04-14 DIAGNOSIS — J301 Allergic rhinitis due to pollen: Secondary | ICD-10-CM | POA: Diagnosis not present

## 2019-04-14 DIAGNOSIS — J3089 Other allergic rhinitis: Secondary | ICD-10-CM | POA: Diagnosis not present

## 2019-04-14 DIAGNOSIS — J3081 Allergic rhinitis due to animal (cat) (dog) hair and dander: Secondary | ICD-10-CM | POA: Diagnosis not present

## 2019-04-17 DIAGNOSIS — S82144D Nondisplaced bicondylar fracture of right tibia, subsequent encounter for closed fracture with routine healing: Secondary | ICD-10-CM | POA: Diagnosis not present

## 2019-04-17 DIAGNOSIS — M25561 Pain in right knee: Secondary | ICD-10-CM | POA: Diagnosis not present

## 2019-04-17 DIAGNOSIS — M6281 Muscle weakness (generalized): Secondary | ICD-10-CM | POA: Diagnosis not present

## 2019-04-17 DIAGNOSIS — M25661 Stiffness of right knee, not elsewhere classified: Secondary | ICD-10-CM | POA: Diagnosis not present

## 2019-04-21 DIAGNOSIS — J301 Allergic rhinitis due to pollen: Secondary | ICD-10-CM | POA: Diagnosis not present

## 2019-04-21 DIAGNOSIS — J3081 Allergic rhinitis due to animal (cat) (dog) hair and dander: Secondary | ICD-10-CM | POA: Diagnosis not present

## 2019-04-21 DIAGNOSIS — J3089 Other allergic rhinitis: Secondary | ICD-10-CM | POA: Diagnosis not present

## 2019-04-24 DIAGNOSIS — M25561 Pain in right knee: Secondary | ICD-10-CM | POA: Diagnosis not present

## 2019-04-24 DIAGNOSIS — M6281 Muscle weakness (generalized): Secondary | ICD-10-CM | POA: Diagnosis not present

## 2019-04-24 DIAGNOSIS — S82144D Nondisplaced bicondylar fracture of right tibia, subsequent encounter for closed fracture with routine healing: Secondary | ICD-10-CM | POA: Diagnosis not present

## 2019-04-24 DIAGNOSIS — M25661 Stiffness of right knee, not elsewhere classified: Secondary | ICD-10-CM | POA: Diagnosis not present

## 2019-05-01 DIAGNOSIS — M25561 Pain in right knee: Secondary | ICD-10-CM | POA: Diagnosis not present

## 2019-05-01 DIAGNOSIS — S82144D Nondisplaced bicondylar fracture of right tibia, subsequent encounter for closed fracture with routine healing: Secondary | ICD-10-CM | POA: Diagnosis not present

## 2019-05-01 DIAGNOSIS — M25661 Stiffness of right knee, not elsewhere classified: Secondary | ICD-10-CM | POA: Diagnosis not present

## 2019-05-01 DIAGNOSIS — M6281 Muscle weakness (generalized): Secondary | ICD-10-CM | POA: Diagnosis not present

## 2019-05-04 DIAGNOSIS — J3081 Allergic rhinitis due to animal (cat) (dog) hair and dander: Secondary | ICD-10-CM | POA: Diagnosis not present

## 2019-05-04 DIAGNOSIS — J301 Allergic rhinitis due to pollen: Secondary | ICD-10-CM | POA: Diagnosis not present

## 2019-05-04 DIAGNOSIS — J3089 Other allergic rhinitis: Secondary | ICD-10-CM | POA: Diagnosis not present

## 2019-05-08 DIAGNOSIS — M25561 Pain in right knee: Secondary | ICD-10-CM | POA: Diagnosis not present

## 2019-05-11 DIAGNOSIS — M25661 Stiffness of right knee, not elsewhere classified: Secondary | ICD-10-CM | POA: Diagnosis not present

## 2019-05-11 DIAGNOSIS — M25561 Pain in right knee: Secondary | ICD-10-CM | POA: Diagnosis not present

## 2019-05-11 DIAGNOSIS — S82144D Nondisplaced bicondylar fracture of right tibia, subsequent encounter for closed fracture with routine healing: Secondary | ICD-10-CM | POA: Diagnosis not present

## 2019-05-11 DIAGNOSIS — M6281 Muscle weakness (generalized): Secondary | ICD-10-CM | POA: Diagnosis not present

## 2019-05-18 MED FILL — LOSARTAN POTASSIUM 50 MG TA: 50 | 30 days supply | Qty: 30 | Fill #6

## 2019-05-30 DIAGNOSIS — J3089 Other allergic rhinitis: Secondary | ICD-10-CM | POA: Diagnosis not present

## 2019-05-30 DIAGNOSIS — J3081 Allergic rhinitis due to animal (cat) (dog) hair and dander: Secondary | ICD-10-CM | POA: Diagnosis not present

## 2019-05-30 DIAGNOSIS — J301 Allergic rhinitis due to pollen: Secondary | ICD-10-CM | POA: Diagnosis not present

## 2019-06-01 DIAGNOSIS — M720 Palmar fascial fibromatosis [Dupuytren]: Secondary | ICD-10-CM | POA: Diagnosis not present

## 2019-06-02 DIAGNOSIS — J3081 Allergic rhinitis due to animal (cat) (dog) hair and dander: Secondary | ICD-10-CM | POA: Diagnosis not present

## 2019-06-02 DIAGNOSIS — J3089 Other allergic rhinitis: Secondary | ICD-10-CM | POA: Diagnosis not present

## 2019-06-02 DIAGNOSIS — J301 Allergic rhinitis due to pollen: Secondary | ICD-10-CM | POA: Diagnosis not present

## 2019-06-02 MED FILL — HYDROXYZINE PAMOATE 25 MG C: 25 | 90 days supply | Qty: 180 | Fill #0

## 2019-06-02 MED FILL — DESVENLAFAXINE SUC ER 50 MG: 50 | 90 days supply | Qty: 90 | Fill #0

## 2019-06-05 MED FILL — VYVANSE 20 MG CAPSULE: 20 | 30 days supply | Qty: 30 | Fill #0

## 2019-06-07 DIAGNOSIS — J3081 Allergic rhinitis due to animal (cat) (dog) hair and dander: Secondary | ICD-10-CM | POA: Diagnosis not present

## 2019-06-07 DIAGNOSIS — J301 Allergic rhinitis due to pollen: Secondary | ICD-10-CM | POA: Diagnosis not present

## 2019-06-07 DIAGNOSIS — J3089 Other allergic rhinitis: Secondary | ICD-10-CM | POA: Diagnosis not present

## 2019-06-15 DIAGNOSIS — J3081 Allergic rhinitis due to animal (cat) (dog) hair and dander: Secondary | ICD-10-CM | POA: Diagnosis not present

## 2019-06-15 DIAGNOSIS — J3089 Other allergic rhinitis: Secondary | ICD-10-CM | POA: Diagnosis not present

## 2019-06-15 DIAGNOSIS — J301 Allergic rhinitis due to pollen: Secondary | ICD-10-CM | POA: Diagnosis not present

## 2019-06-19 MED FILL — LOSARTAN POTASSIUM 50 MG TA: 50 | 30 days supply | Qty: 30 | Fill #7

## 2019-06-22 DIAGNOSIS — J3089 Other allergic rhinitis: Secondary | ICD-10-CM | POA: Diagnosis not present

## 2019-06-22 DIAGNOSIS — J301 Allergic rhinitis due to pollen: Secondary | ICD-10-CM | POA: Diagnosis not present

## 2019-06-22 DIAGNOSIS — J3081 Allergic rhinitis due to animal (cat) (dog) hair and dander: Secondary | ICD-10-CM | POA: Diagnosis not present

## 2019-06-29 DIAGNOSIS — M720 Palmar fascial fibromatosis [Dupuytren]: Secondary | ICD-10-CM | POA: Diagnosis not present

## 2019-06-29 DIAGNOSIS — M79641 Pain in right hand: Secondary | ICD-10-CM | POA: Diagnosis not present

## 2019-06-30 DIAGNOSIS — J3081 Allergic rhinitis due to animal (cat) (dog) hair and dander: Secondary | ICD-10-CM | POA: Diagnosis not present

## 2019-06-30 DIAGNOSIS — J301 Allergic rhinitis due to pollen: Secondary | ICD-10-CM | POA: Diagnosis not present

## 2019-06-30 DIAGNOSIS — J3089 Other allergic rhinitis: Secondary | ICD-10-CM | POA: Diagnosis not present

## 2019-07-04 DIAGNOSIS — L298 Other pruritus: Secondary | ICD-10-CM | POA: Diagnosis not present

## 2019-07-04 DIAGNOSIS — J3081 Allergic rhinitis due to animal (cat) (dog) hair and dander: Secondary | ICD-10-CM | POA: Diagnosis not present

## 2019-07-04 DIAGNOSIS — J301 Allergic rhinitis due to pollen: Secondary | ICD-10-CM | POA: Diagnosis not present

## 2019-07-04 DIAGNOSIS — H1045 Other chronic allergic conjunctivitis: Secondary | ICD-10-CM | POA: Diagnosis not present

## 2019-07-04 DIAGNOSIS — J3089 Other allergic rhinitis: Secondary | ICD-10-CM | POA: Diagnosis not present

## 2019-07-04 MED FILL — hydrOXYzine HCL 25 MG TABS: 25 | 30 days supply | Qty: 90 | Fill #0

## 2019-07-06 MED FILL — VYVANSE 40 MG CAPSULE: 40 | 30 days supply | Qty: 30 | Fill #0

## 2019-07-14 DIAGNOSIS — J3081 Allergic rhinitis due to animal (cat) (dog) hair and dander: Secondary | ICD-10-CM | POA: Diagnosis not present

## 2019-07-14 DIAGNOSIS — J301 Allergic rhinitis due to pollen: Secondary | ICD-10-CM | POA: Diagnosis not present

## 2019-07-14 DIAGNOSIS — J3089 Other allergic rhinitis: Secondary | ICD-10-CM | POA: Diagnosis not present

## 2019-07-19 MED FILL — LOSARTAN POTASSIUM 50 MG TA: 50 | 30 days supply | Qty: 30 | Fill #8

## 2019-07-21 DIAGNOSIS — J3089 Other allergic rhinitis: Secondary | ICD-10-CM | POA: Diagnosis not present

## 2019-07-21 DIAGNOSIS — J3081 Allergic rhinitis due to animal (cat) (dog) hair and dander: Secondary | ICD-10-CM | POA: Diagnosis not present

## 2019-07-21 DIAGNOSIS — J301 Allergic rhinitis due to pollen: Secondary | ICD-10-CM | POA: Diagnosis not present

## 2019-07-28 DIAGNOSIS — J3081 Allergic rhinitis due to animal (cat) (dog) hair and dander: Secondary | ICD-10-CM | POA: Diagnosis not present

## 2019-07-28 DIAGNOSIS — J301 Allergic rhinitis due to pollen: Secondary | ICD-10-CM | POA: Diagnosis not present

## 2019-07-28 DIAGNOSIS — J3089 Other allergic rhinitis: Secondary | ICD-10-CM | POA: Diagnosis not present

## 2019-08-04 DIAGNOSIS — J3089 Other allergic rhinitis: Secondary | ICD-10-CM | POA: Diagnosis not present

## 2019-08-04 DIAGNOSIS — J3081 Allergic rhinitis due to animal (cat) (dog) hair and dander: Secondary | ICD-10-CM | POA: Diagnosis not present

## 2019-08-04 DIAGNOSIS — J301 Allergic rhinitis due to pollen: Secondary | ICD-10-CM | POA: Diagnosis not present

## 2019-08-11 DIAGNOSIS — J301 Allergic rhinitis due to pollen: Secondary | ICD-10-CM | POA: Diagnosis not present

## 2019-08-11 DIAGNOSIS — J3081 Allergic rhinitis due to animal (cat) (dog) hair and dander: Secondary | ICD-10-CM | POA: Diagnosis not present

## 2019-08-11 DIAGNOSIS — J3089 Other allergic rhinitis: Secondary | ICD-10-CM | POA: Diagnosis not present

## 2019-08-16 MED FILL — LOSARTAN POTASSIUM 50 MG TA: 50 | 30 days supply | Qty: 30 | Fill #0

## 2019-08-23 DIAGNOSIS — J3089 Other allergic rhinitis: Secondary | ICD-10-CM | POA: Diagnosis not present

## 2019-08-23 DIAGNOSIS — J3081 Allergic rhinitis due to animal (cat) (dog) hair and dander: Secondary | ICD-10-CM | POA: Diagnosis not present

## 2019-08-23 DIAGNOSIS — J301 Allergic rhinitis due to pollen: Secondary | ICD-10-CM | POA: Diagnosis not present

## 2019-09-06 DIAGNOSIS — J301 Allergic rhinitis due to pollen: Secondary | ICD-10-CM | POA: Diagnosis not present

## 2019-09-06 DIAGNOSIS — J3089 Other allergic rhinitis: Secondary | ICD-10-CM | POA: Diagnosis not present

## 2019-09-06 DIAGNOSIS — J3081 Allergic rhinitis due to animal (cat) (dog) hair and dander: Secondary | ICD-10-CM | POA: Diagnosis not present

## 2019-09-20 DIAGNOSIS — J3089 Other allergic rhinitis: Secondary | ICD-10-CM | POA: Diagnosis not present

## 2019-09-20 DIAGNOSIS — J3081 Allergic rhinitis due to animal (cat) (dog) hair and dander: Secondary | ICD-10-CM | POA: Diagnosis not present

## 2019-09-20 DIAGNOSIS — J301 Allergic rhinitis due to pollen: Secondary | ICD-10-CM | POA: Diagnosis not present

## 2019-09-22 MED FILL — LOSARTAN POTASSIUM 50 MG TA: 50 | 30 days supply | Qty: 30 | Fill #1

## 2019-10-03 DIAGNOSIS — J3081 Allergic rhinitis due to animal (cat) (dog) hair and dander: Secondary | ICD-10-CM | POA: Diagnosis not present

## 2019-10-03 DIAGNOSIS — J3089 Other allergic rhinitis: Secondary | ICD-10-CM | POA: Diagnosis not present

## 2019-10-03 DIAGNOSIS — J301 Allergic rhinitis due to pollen: Secondary | ICD-10-CM | POA: Diagnosis not present

## 2019-10-04 MED FILL — VYVANSE 40 MG CAPSULE: 40 | 30 days supply | Qty: 30 | Fill #0

## 2019-10-13 DIAGNOSIS — J301 Allergic rhinitis due to pollen: Secondary | ICD-10-CM | POA: Diagnosis not present

## 2019-10-13 DIAGNOSIS — J3089 Other allergic rhinitis: Secondary | ICD-10-CM | POA: Diagnosis not present

## 2019-10-13 DIAGNOSIS — J3081 Allergic rhinitis due to animal (cat) (dog) hair and dander: Secondary | ICD-10-CM | POA: Diagnosis not present

## 2019-10-19 DIAGNOSIS — J3081 Allergic rhinitis due to animal (cat) (dog) hair and dander: Secondary | ICD-10-CM | POA: Diagnosis not present

## 2019-10-19 DIAGNOSIS — J301 Allergic rhinitis due to pollen: Secondary | ICD-10-CM | POA: Diagnosis not present

## 2019-10-19 DIAGNOSIS — J3089 Other allergic rhinitis: Secondary | ICD-10-CM | POA: Diagnosis not present

## 2019-10-19 MED FILL — LOSARTAN POTASSIUM 50 MG TA: 50 | 30 days supply | Qty: 30 | Fill #2

## 2019-10-27 ENCOUNTER — Ambulatory Visit
Admission: RE | Admit: 2019-10-27 | Discharge: 2019-10-27 | Disposition: A | Discharge status: Home / Self Care | Payer: Self-pay | Source: Ambulatory Visit | Attending: Internal Medicine | Admitting: Internal Medicine

## 2019-10-27 ENCOUNTER — Other Ambulatory Visit: Payer: Self-pay | Admitting: Internal Medicine

## 2019-10-27 DIAGNOSIS — M47816 Spondylosis without myelopathy or radiculopathy, lumbar region: Secondary | ICD-10-CM | POA: Diagnosis not present

## 2019-10-27 DIAGNOSIS — M4186 Other forms of scoliosis, lumbar region: Secondary | ICD-10-CM | POA: Diagnosis not present

## 2019-10-27 DIAGNOSIS — J3081 Allergic rhinitis due to animal (cat) (dog) hair and dander: Secondary | ICD-10-CM | POA: Diagnosis not present

## 2019-10-27 DIAGNOSIS — M533 Sacrococcygeal disorders, not elsewhere classified: Secondary | ICD-10-CM | POA: Diagnosis not present

## 2019-10-27 DIAGNOSIS — M545 Low back pain, unspecified: Secondary | ICD-10-CM

## 2019-10-27 DIAGNOSIS — J3089 Other allergic rhinitis: Secondary | ICD-10-CM | POA: Diagnosis not present

## 2019-10-27 DIAGNOSIS — J301 Allergic rhinitis due to pollen: Secondary | ICD-10-CM | POA: Diagnosis not present

## 2019-10-27 MED FILL — MELOXICAM 15 MG TABLET: 15 | 30 days supply | Qty: 30 | Fill #0

## 2019-10-30 MED FILL — EPINEPHRINE 0.3 MG AUTO-INJ: 0.3 | 30 days supply | Qty: 2 | Fill #0

## 2019-11-01 MED FILL — VYVANSE 40 MG CAPSULE: 40 | 30 days supply | Qty: 30 | Fill #0

## 2019-11-02 DIAGNOSIS — J3081 Allergic rhinitis due to animal (cat) (dog) hair and dander: Secondary | ICD-10-CM | POA: Diagnosis not present

## 2019-11-02 DIAGNOSIS — J3089 Other allergic rhinitis: Secondary | ICD-10-CM | POA: Diagnosis not present

## 2019-11-02 DIAGNOSIS — J301 Allergic rhinitis due to pollen: Secondary | ICD-10-CM | POA: Diagnosis not present

## 2019-11-03 DIAGNOSIS — J3089 Other allergic rhinitis: Secondary | ICD-10-CM | POA: Diagnosis not present

## 2019-11-03 DIAGNOSIS — J3081 Allergic rhinitis due to animal (cat) (dog) hair and dander: Secondary | ICD-10-CM | POA: Diagnosis not present

## 2019-11-03 DIAGNOSIS — J301 Allergic rhinitis due to pollen: Secondary | ICD-10-CM | POA: Diagnosis not present

## 2019-11-06 DIAGNOSIS — M545 Low back pain: Secondary | ICD-10-CM | POA: Diagnosis not present

## 2019-11-06 MED FILL — predniSONE 5 MG TABS: 5 | 12 days supply | Qty: 48 | Fill #0

## 2019-11-07 DIAGNOSIS — M545 Low back pain: Secondary | ICD-10-CM | POA: Diagnosis not present

## 2019-11-09 DIAGNOSIS — J3089 Other allergic rhinitis: Secondary | ICD-10-CM | POA: Diagnosis not present

## 2019-11-09 DIAGNOSIS — J3081 Allergic rhinitis due to animal (cat) (dog) hair and dander: Secondary | ICD-10-CM | POA: Diagnosis not present

## 2019-11-09 DIAGNOSIS — J301 Allergic rhinitis due to pollen: Secondary | ICD-10-CM | POA: Diagnosis not present

## 2019-11-13 DIAGNOSIS — M545 Low back pain: Secondary | ICD-10-CM | POA: Diagnosis not present

## 2019-11-15 DIAGNOSIS — D649 Anemia, unspecified: Secondary | ICD-10-CM | POA: Diagnosis not present

## 2019-11-15 DIAGNOSIS — R5383 Other fatigue: Secondary | ICD-10-CM | POA: Diagnosis not present

## 2019-11-15 DIAGNOSIS — E559 Vitamin D deficiency, unspecified: Secondary | ICD-10-CM | POA: Diagnosis not present

## 2019-11-15 DIAGNOSIS — M545 Low back pain: Secondary | ICD-10-CM | POA: Diagnosis not present

## 2019-11-16 DIAGNOSIS — J301 Allergic rhinitis due to pollen: Secondary | ICD-10-CM | POA: Diagnosis not present

## 2019-11-16 DIAGNOSIS — J3081 Allergic rhinitis due to animal (cat) (dog) hair and dander: Secondary | ICD-10-CM | POA: Diagnosis not present

## 2019-11-16 DIAGNOSIS — J3089 Other allergic rhinitis: Secondary | ICD-10-CM | POA: Diagnosis not present

## 2019-11-21 DIAGNOSIS — M545 Low back pain: Secondary | ICD-10-CM | POA: Diagnosis not present

## 2019-11-23 DIAGNOSIS — M545 Low back pain: Secondary | ICD-10-CM | POA: Diagnosis not present

## 2019-11-23 MED FILL — LOSARTAN POTASSIUM 50 MG TA: 50 | 30 days supply | Qty: 30 | Fill #3

## 2019-11-28 DIAGNOSIS — I10 Essential (primary) hypertension: Secondary | ICD-10-CM | POA: Diagnosis not present

## 2019-11-28 DIAGNOSIS — F329 Major depressive disorder, single episode, unspecified: Secondary | ICD-10-CM | POA: Diagnosis not present

## 2019-11-28 DIAGNOSIS — F339 Major depressive disorder, recurrent, unspecified: Secondary | ICD-10-CM | POA: Diagnosis not present

## 2019-11-28 DIAGNOSIS — Z9109 Other allergy status, other than to drugs and biological substances: Secondary | ICD-10-CM | POA: Diagnosis not present

## 2019-11-28 DIAGNOSIS — K222 Esophageal obstruction: Secondary | ICD-10-CM | POA: Diagnosis not present

## 2019-11-28 DIAGNOSIS — S39012D Strain of muscle, fascia and tendon of lower back, subsequent encounter: Secondary | ICD-10-CM | POA: Diagnosis not present

## 2019-11-28 DIAGNOSIS — Z79899 Other long term (current) drug therapy: Secondary | ICD-10-CM | POA: Diagnosis not present

## 2019-11-28 DIAGNOSIS — G4700 Insomnia, unspecified: Secondary | ICD-10-CM | POA: Diagnosis not present

## 2019-11-28 DIAGNOSIS — E785 Hyperlipidemia, unspecified: Secondary | ICD-10-CM | POA: Diagnosis not present

## 2019-11-28 DIAGNOSIS — Z86010 Personal history of colonic polyps: Secondary | ICD-10-CM | POA: Diagnosis not present

## 2019-11-28 DIAGNOSIS — Z0000 Encounter for general adult medical examination without abnormal findings: Secondary | ICD-10-CM | POA: Diagnosis not present

## 2019-11-28 DIAGNOSIS — H9190 Unspecified hearing loss, unspecified ear: Secondary | ICD-10-CM | POA: Diagnosis not present

## 2019-11-28 DIAGNOSIS — E2839 Other primary ovarian failure: Secondary | ICD-10-CM | POA: Diagnosis not present

## 2019-11-28 DIAGNOSIS — F909 Attention-deficit hyperactivity disorder, unspecified type: Secondary | ICD-10-CM | POA: Diagnosis not present

## 2019-12-01 DIAGNOSIS — J3081 Allergic rhinitis due to animal (cat) (dog) hair and dander: Secondary | ICD-10-CM | POA: Diagnosis not present

## 2019-12-01 DIAGNOSIS — J3089 Other allergic rhinitis: Secondary | ICD-10-CM | POA: Diagnosis not present

## 2019-12-01 DIAGNOSIS — J301 Allergic rhinitis due to pollen: Secondary | ICD-10-CM | POA: Diagnosis not present

## 2019-12-08 DIAGNOSIS — J3081 Allergic rhinitis due to animal (cat) (dog) hair and dander: Secondary | ICD-10-CM | POA: Diagnosis not present

## 2019-12-08 DIAGNOSIS — J3089 Other allergic rhinitis: Secondary | ICD-10-CM | POA: Diagnosis not present

## 2019-12-08 DIAGNOSIS — J301 Allergic rhinitis due to pollen: Secondary | ICD-10-CM | POA: Diagnosis not present

## 2019-12-08 MED FILL — HYDROXYZINE PAMOATE 25 MG C: 25 | 90 days supply | Qty: 180 | Fill #1

## 2019-12-11 DIAGNOSIS — M85852 Other specified disorders of bone density and structure, left thigh: Secondary | ICD-10-CM | POA: Diagnosis not present

## 2019-12-11 DIAGNOSIS — M85851 Other specified disorders of bone density and structure, right thigh: Secondary | ICD-10-CM | POA: Diagnosis not present

## 2019-12-11 DIAGNOSIS — Z780 Asymptomatic menopausal state: Secondary | ICD-10-CM | POA: Diagnosis not present

## 2019-12-11 DIAGNOSIS — M810 Age-related osteoporosis without current pathological fracture: Secondary | ICD-10-CM | POA: Diagnosis not present

## 2019-12-12 DIAGNOSIS — F331 Major depressive disorder, recurrent, moderate: Secondary | ICD-10-CM | POA: Diagnosis not present

## 2019-12-12 DIAGNOSIS — F900 Attention-deficit hyperactivity disorder, predominantly inattentive type: Secondary | ICD-10-CM | POA: Diagnosis not present

## 2019-12-12 MED FILL — DESVENLAFAXINE SUC ER 50 MG: 50 | 90 days supply | Qty: 90 | Fill #0

## 2019-12-12 MED FILL — VYVANSE 40 MG CAPSULE: 40 | 30 days supply | Qty: 30 | Fill #0

## 2019-12-22 DIAGNOSIS — J3089 Other allergic rhinitis: Secondary | ICD-10-CM | POA: Diagnosis not present

## 2019-12-22 DIAGNOSIS — J301 Allergic rhinitis due to pollen: Secondary | ICD-10-CM | POA: Diagnosis not present

## 2019-12-22 DIAGNOSIS — J3081 Allergic rhinitis due to animal (cat) (dog) hair and dander: Secondary | ICD-10-CM | POA: Diagnosis not present

## 2019-12-27 DIAGNOSIS — J3081 Allergic rhinitis due to animal (cat) (dog) hair and dander: Secondary | ICD-10-CM | POA: Diagnosis not present

## 2019-12-27 DIAGNOSIS — J3089 Other allergic rhinitis: Secondary | ICD-10-CM | POA: Diagnosis not present

## 2019-12-27 DIAGNOSIS — J301 Allergic rhinitis due to pollen: Secondary | ICD-10-CM | POA: Diagnosis not present

## 2019-12-29 ENCOUNTER — Other Ambulatory Visit (HOSPITAL_COMMUNITY): Payer: Self-pay | Admitting: Internal Medicine

## 2019-12-29 DIAGNOSIS — J3089 Other allergic rhinitis: Secondary | ICD-10-CM | POA: Diagnosis not present

## 2019-12-29 DIAGNOSIS — J301 Allergic rhinitis due to pollen: Secondary | ICD-10-CM | POA: Diagnosis not present

## 2019-12-29 DIAGNOSIS — J3081 Allergic rhinitis due to animal (cat) (dog) hair and dander: Secondary | ICD-10-CM | POA: Diagnosis not present

## 2019-12-29 MED FILL — LOSARTAN POTASSIUM 50 MG TA: 50 | 30 days supply | Qty: 30 | Fill #0

## 2020-01-03 DIAGNOSIS — J301 Allergic rhinitis due to pollen: Secondary | ICD-10-CM | POA: Diagnosis not present

## 2020-01-03 DIAGNOSIS — J3089 Other allergic rhinitis: Secondary | ICD-10-CM | POA: Diagnosis not present

## 2020-01-03 DIAGNOSIS — J3081 Allergic rhinitis due to animal (cat) (dog) hair and dander: Secondary | ICD-10-CM | POA: Diagnosis not present

## 2020-01-08 DIAGNOSIS — Z1231 Encounter for screening mammogram for malignant neoplasm of breast: Secondary | ICD-10-CM | POA: Diagnosis not present

## 2020-01-10 DIAGNOSIS — J301 Allergic rhinitis due to pollen: Secondary | ICD-10-CM | POA: Diagnosis not present

## 2020-01-10 DIAGNOSIS — J3081 Allergic rhinitis due to animal (cat) (dog) hair and dander: Secondary | ICD-10-CM | POA: Diagnosis not present

## 2020-01-10 DIAGNOSIS — J3089 Other allergic rhinitis: Secondary | ICD-10-CM | POA: Diagnosis not present

## 2020-01-15 MED FILL — VYVANSE 40 MG CAPSULE: 40 | 30 days supply | Qty: 30 | Fill #0

## 2020-01-24 DIAGNOSIS — J3089 Other allergic rhinitis: Secondary | ICD-10-CM | POA: Diagnosis not present

## 2020-01-24 DIAGNOSIS — J3081 Allergic rhinitis due to animal (cat) (dog) hair and dander: Secondary | ICD-10-CM | POA: Diagnosis not present

## 2020-01-24 DIAGNOSIS — J301 Allergic rhinitis due to pollen: Secondary | ICD-10-CM | POA: Diagnosis not present

## 2020-02-01 MED FILL — LOSARTAN POTASSIUM 50 MG TA: 50 | 30 days supply | Qty: 30 | Fill #1

## 2020-02-07 DIAGNOSIS — J3089 Other allergic rhinitis: Secondary | ICD-10-CM | POA: Diagnosis not present

## 2020-02-07 DIAGNOSIS — J301 Allergic rhinitis due to pollen: Secondary | ICD-10-CM | POA: Diagnosis not present

## 2020-02-07 DIAGNOSIS — J3081 Allergic rhinitis due to animal (cat) (dog) hair and dander: Secondary | ICD-10-CM | POA: Diagnosis not present

## 2020-02-13 MED FILL — VYVANSE 40 MG CAPSULE: 40 | 30 days supply | Qty: 30 | Fill #0

## 2020-02-14 DIAGNOSIS — J3089 Other allergic rhinitis: Secondary | ICD-10-CM | POA: Diagnosis not present

## 2020-02-14 DIAGNOSIS — J3081 Allergic rhinitis due to animal (cat) (dog) hair and dander: Secondary | ICD-10-CM | POA: Diagnosis not present

## 2020-02-14 DIAGNOSIS — J301 Allergic rhinitis due to pollen: Secondary | ICD-10-CM | POA: Diagnosis not present

## 2020-02-21 DIAGNOSIS — J301 Allergic rhinitis due to pollen: Secondary | ICD-10-CM | POA: Diagnosis not present

## 2020-02-21 DIAGNOSIS — J3081 Allergic rhinitis due to animal (cat) (dog) hair and dander: Secondary | ICD-10-CM | POA: Diagnosis not present

## 2020-02-21 DIAGNOSIS — J3089 Other allergic rhinitis: Secondary | ICD-10-CM | POA: Diagnosis not present

## 2020-02-28 DIAGNOSIS — J301 Allergic rhinitis due to pollen: Secondary | ICD-10-CM | POA: Diagnosis not present

## 2020-02-28 DIAGNOSIS — J3089 Other allergic rhinitis: Secondary | ICD-10-CM | POA: Diagnosis not present

## 2020-02-28 DIAGNOSIS — J3081 Allergic rhinitis due to animal (cat) (dog) hair and dander: Secondary | ICD-10-CM | POA: Diagnosis not present

## 2020-03-08 ENCOUNTER — Other Ambulatory Visit (HOSPITAL_COMMUNITY): Payer: Self-pay | Admitting: Nurse Practitioner

## 2020-03-08 DIAGNOSIS — J3089 Other allergic rhinitis: Secondary | ICD-10-CM | POA: Diagnosis not present

## 2020-03-08 DIAGNOSIS — J301 Allergic rhinitis due to pollen: Secondary | ICD-10-CM | POA: Diagnosis not present

## 2020-03-08 DIAGNOSIS — J3081 Allergic rhinitis due to animal (cat) (dog) hair and dander: Secondary | ICD-10-CM | POA: Diagnosis not present

## 2020-03-08 MED FILL — DESVENLAFAXINE SUC ER 50 MG: 50 | 30 days supply | Qty: 30 | Fill #0

## 2020-03-08 MED FILL — HYDROXYZINE PAMOATE 25 MG C: 25 | 90 days supply | Qty: 180 | Fill #0

## 2020-03-13 MED FILL — VYVANSE 40 MG CAPSULE: 40 | 30 days supply | Qty: 30 | Fill #0

## 2020-03-14 MED FILL — LOSARTAN POTASSIUM 50 MG TA: 50 | 30 days supply | Qty: 30 | Fill #2

## 2020-03-25 DIAGNOSIS — J301 Allergic rhinitis due to pollen: Secondary | ICD-10-CM | POA: Diagnosis not present

## 2020-03-25 DIAGNOSIS — J3081 Allergic rhinitis due to animal (cat) (dog) hair and dander: Secondary | ICD-10-CM | POA: Diagnosis not present

## 2020-03-25 DIAGNOSIS — J3089 Other allergic rhinitis: Secondary | ICD-10-CM | POA: Diagnosis not present

## 2020-04-04 DIAGNOSIS — J3089 Other allergic rhinitis: Secondary | ICD-10-CM | POA: Diagnosis not present

## 2020-04-04 DIAGNOSIS — J301 Allergic rhinitis due to pollen: Secondary | ICD-10-CM | POA: Diagnosis not present

## 2020-04-04 DIAGNOSIS — J3081 Allergic rhinitis due to animal (cat) (dog) hair and dander: Secondary | ICD-10-CM | POA: Diagnosis not present

## 2020-04-10 DIAGNOSIS — J3089 Other allergic rhinitis: Secondary | ICD-10-CM | POA: Diagnosis not present

## 2020-04-10 DIAGNOSIS — J3081 Allergic rhinitis due to animal (cat) (dog) hair and dander: Secondary | ICD-10-CM | POA: Diagnosis not present

## 2020-04-10 DIAGNOSIS — J301 Allergic rhinitis due to pollen: Secondary | ICD-10-CM | POA: Diagnosis not present

## 2020-04-15 MED FILL — LOSARTAN POTASSIUM 50 MG TA: 50 | 30 days supply | Qty: 30 | Fill #3

## 2020-04-16 MED FILL — VYVANSE 40 MG CAPSULE: 40 | 30 days supply | Qty: 30 | Fill #0

## 2020-04-18 DIAGNOSIS — J3081 Allergic rhinitis due to animal (cat) (dog) hair and dander: Secondary | ICD-10-CM | POA: Diagnosis not present

## 2020-04-18 DIAGNOSIS — J301 Allergic rhinitis due to pollen: Secondary | ICD-10-CM | POA: Diagnosis not present

## 2020-04-18 DIAGNOSIS — J3089 Other allergic rhinitis: Secondary | ICD-10-CM | POA: Diagnosis not present

## 2020-04-19 ENCOUNTER — Other Ambulatory Visit (HOSPITAL_COMMUNITY): Payer: Self-pay | Admitting: Internal Medicine

## 2020-04-19 DIAGNOSIS — R091 Pleurisy: Secondary | ICD-10-CM | POA: Diagnosis not present

## 2020-04-19 MED FILL — predniSONE 5 MG TABS: 5 | 10 days supply | Qty: 30 | Fill #0

## 2020-04-23 MED FILL — DESVENLAFAXINE SUC ER 50 MG: 50 | 30 days supply | Qty: 30 | Fill #1

## 2020-05-10 DIAGNOSIS — J3089 Other allergic rhinitis: Secondary | ICD-10-CM | POA: Diagnosis not present

## 2020-05-10 DIAGNOSIS — J3081 Allergic rhinitis due to animal (cat) (dog) hair and dander: Secondary | ICD-10-CM | POA: Diagnosis not present

## 2020-05-10 DIAGNOSIS — J301 Allergic rhinitis due to pollen: Secondary | ICD-10-CM | POA: Diagnosis not present

## 2020-05-14 DIAGNOSIS — J301 Allergic rhinitis due to pollen: Secondary | ICD-10-CM | POA: Diagnosis not present

## 2020-05-14 DIAGNOSIS — J3089 Other allergic rhinitis: Secondary | ICD-10-CM | POA: Diagnosis not present

## 2020-05-14 DIAGNOSIS — J3081 Allergic rhinitis due to animal (cat) (dog) hair and dander: Secondary | ICD-10-CM | POA: Diagnosis not present

## 2020-05-16 MED FILL — LOSARTAN POTASSIUM 50 MG TA: 50 | 30 days supply | Qty: 30 | Fill #4

## 2020-05-17 MED FILL — VYVANSE 40 MG CAPSULE: 40 | 30 days supply | Qty: 30 | Fill #0

## 2020-05-24 DIAGNOSIS — J301 Allergic rhinitis due to pollen: Secondary | ICD-10-CM | POA: Diagnosis not present

## 2020-05-24 DIAGNOSIS — J3089 Other allergic rhinitis: Secondary | ICD-10-CM | POA: Diagnosis not present

## 2020-05-24 DIAGNOSIS — J3081 Allergic rhinitis due to animal (cat) (dog) hair and dander: Secondary | ICD-10-CM | POA: Diagnosis not present

## 2020-05-24 MED FILL — DESVENLAFAXINE SUC ER 50 MG: 50 | 30 days supply | Qty: 30 | Fill #2

## 2020-05-29 DIAGNOSIS — J3081 Allergic rhinitis due to animal (cat) (dog) hair and dander: Secondary | ICD-10-CM | POA: Diagnosis not present

## 2020-05-29 DIAGNOSIS — J3089 Other allergic rhinitis: Secondary | ICD-10-CM | POA: Diagnosis not present

## 2020-05-29 DIAGNOSIS — J301 Allergic rhinitis due to pollen: Secondary | ICD-10-CM | POA: Diagnosis not present

## 2020-06-04 ENCOUNTER — Other Ambulatory Visit (HOSPITAL_COMMUNITY): Payer: Self-pay | Admitting: Nurse Practitioner

## 2020-06-04 DIAGNOSIS — F900 Attention-deficit hyperactivity disorder, predominantly inattentive type: Secondary | ICD-10-CM | POA: Diagnosis not present

## 2020-06-04 DIAGNOSIS — F331 Major depressive disorder, recurrent, moderate: Secondary | ICD-10-CM | POA: Diagnosis not present

## 2020-06-10 DIAGNOSIS — T733XXA Exhaustion due to excessive exertion, initial encounter: Secondary | ICD-10-CM | POA: Diagnosis not present

## 2020-06-10 DIAGNOSIS — R000 Tachycardia, unspecified: Secondary | ICD-10-CM | POA: Diagnosis not present

## 2020-06-10 DIAGNOSIS — E7800 Pure hypercholesterolemia, unspecified: Secondary | ICD-10-CM | POA: Diagnosis not present

## 2020-06-14 DIAGNOSIS — J301 Allergic rhinitis due to pollen: Secondary | ICD-10-CM | POA: Diagnosis not present

## 2020-06-14 DIAGNOSIS — J3081 Allergic rhinitis due to animal (cat) (dog) hair and dander: Secondary | ICD-10-CM | POA: Diagnosis not present

## 2020-06-14 DIAGNOSIS — J3089 Other allergic rhinitis: Secondary | ICD-10-CM | POA: Diagnosis not present

## 2020-06-17 MED FILL — DESVENLAFAXINE SUC ER 50 MG: 50 | 30 days supply | Qty: 30 | Fill #3

## 2020-06-17 MED FILL — VYVANSE 40 MG CAPSULE: 40 | 30 days supply | Qty: 30 | Fill #0

## 2020-06-17 MED FILL — LOSARTAN POTASSIUM 50 MG TA: 50 | 30 days supply | Qty: 30 | Fill #5

## 2020-06-28 DIAGNOSIS — J3089 Other allergic rhinitis: Secondary | ICD-10-CM | POA: Diagnosis not present

## 2020-06-28 DIAGNOSIS — J3081 Allergic rhinitis due to animal (cat) (dog) hair and dander: Secondary | ICD-10-CM | POA: Diagnosis not present

## 2020-06-28 DIAGNOSIS — J301 Allergic rhinitis due to pollen: Secondary | ICD-10-CM | POA: Diagnosis not present

## 2020-07-02 ENCOUNTER — Other Ambulatory Visit (HOSPITAL_COMMUNITY): Payer: Self-pay

## 2020-07-02 DIAGNOSIS — J301 Allergic rhinitis due to pollen: Secondary | ICD-10-CM | POA: Diagnosis not present

## 2020-07-02 DIAGNOSIS — H1045 Other chronic allergic conjunctivitis: Secondary | ICD-10-CM | POA: Diagnosis not present

## 2020-07-02 DIAGNOSIS — J3081 Allergic rhinitis due to animal (cat) (dog) hair and dander: Secondary | ICD-10-CM | POA: Diagnosis not present

## 2020-07-02 DIAGNOSIS — J3089 Other allergic rhinitis: Secondary | ICD-10-CM | POA: Diagnosis not present

## 2020-07-02 DIAGNOSIS — L298 Other pruritus: Secondary | ICD-10-CM | POA: Diagnosis not present

## 2020-07-02 MED ORDER — AZELASTINE HCL 0.05 % OP SOLN
OPHTHALMIC | 3 refills | Status: DC
  Filled 2020-07-02: qty 6, 30d supply, fill #0

## 2020-07-12 DIAGNOSIS — J301 Allergic rhinitis due to pollen: Secondary | ICD-10-CM | POA: Diagnosis not present

## 2020-07-12 DIAGNOSIS — J3081 Allergic rhinitis due to animal (cat) (dog) hair and dander: Secondary | ICD-10-CM | POA: Diagnosis not present

## 2020-07-12 DIAGNOSIS — J3089 Other allergic rhinitis: Secondary | ICD-10-CM | POA: Diagnosis not present

## 2020-07-17 DIAGNOSIS — J301 Allergic rhinitis due to pollen: Secondary | ICD-10-CM | POA: Diagnosis not present

## 2020-07-17 DIAGNOSIS — J3089 Other allergic rhinitis: Secondary | ICD-10-CM | POA: Diagnosis not present

## 2020-07-17 DIAGNOSIS — J3081 Allergic rhinitis due to animal (cat) (dog) hair and dander: Secondary | ICD-10-CM | POA: Diagnosis not present

## 2020-07-18 DIAGNOSIS — M818 Other osteoporosis without current pathological fracture: Secondary | ICD-10-CM | POA: Diagnosis not present

## 2020-07-18 DIAGNOSIS — I10 Essential (primary) hypertension: Secondary | ICD-10-CM | POA: Diagnosis not present

## 2020-07-18 DIAGNOSIS — F339 Major depressive disorder, recurrent, unspecified: Secondary | ICD-10-CM | POA: Diagnosis not present

## 2020-07-18 DIAGNOSIS — F324 Major depressive disorder, single episode, in partial remission: Secondary | ICD-10-CM | POA: Diagnosis not present

## 2020-07-18 DIAGNOSIS — G4700 Insomnia, unspecified: Secondary | ICD-10-CM | POA: Diagnosis not present

## 2020-07-18 DIAGNOSIS — E785 Hyperlipidemia, unspecified: Secondary | ICD-10-CM | POA: Diagnosis not present

## 2020-07-18 DIAGNOSIS — E7800 Pure hypercholesterolemia, unspecified: Secondary | ICD-10-CM | POA: Diagnosis not present

## 2020-07-18 DIAGNOSIS — F329 Major depressive disorder, single episode, unspecified: Secondary | ICD-10-CM | POA: Diagnosis not present

## 2020-07-19 ENCOUNTER — Other Ambulatory Visit (HOSPITAL_COMMUNITY): Payer: Self-pay

## 2020-07-19 MED FILL — Desvenlafaxine Succinate Tab ER 24HR 50 MG (Base Equiv): ORAL | 90 days supply | Qty: 90 | Fill #0 | Status: AC

## 2020-07-19 MED FILL — Lisdexamfetamine Dimesylate Cap 40 MG: ORAL | 30 days supply | Qty: 30 | Fill #0 | Status: AC

## 2020-07-19 MED FILL — Losartan Potassium Tab 50 MG: ORAL | 30 days supply | Qty: 30 | Fill #0 | Status: AC

## 2020-07-20 ENCOUNTER — Other Ambulatory Visit (HOSPITAL_COMMUNITY): Payer: Self-pay

## 2020-07-21 NOTE — Progress Notes (Signed)
Cardiology Office Note:   Date:  07/22/2020  NAME:  Tiffany Curtis    MRN: [id number] DOB:  Mar 29, 1955   PCP:  Josetta Huddle, MD  Cardiologist:  No primary care provider on file.  Electrophysiologist:  None   Referring MD: Josetta Huddle, MD   Chief Complaint  Patient presents with  . Shortness of Breath   History of Present Illness:   Tiffany Curtis is a 65 y.o. female with a hx of HTN who is being seen today for the evaluation of chest pain/shortness of breath at the request of Josetta Huddle, MD.  She reports for the last 3 to 4 months she has had episodes of shortness of breath when she tries to exercise.  She is a high functioning athlete.  She reports that she is running 3 miles 4 days/week.  She also reports on days she does not run she does elliptical work.  She can exercise for up to 1 hour.  Over the past 3 to 4 months.  She has had symptoms of shortness of breath.  She reports she cannot just do the level of activity she was once doing.  Apparently in January she was diagnosed with pleurisy.  She had some intermittent chest tightness and chest pain.  This improved with steroids.  She is had no further episodes of chest pain but she reports that her shortness of breath is not resolved.  She also reports that her heart rate can increase inappropriately for exercise.  Her EKG in office demonstrates a left bundle branch block.  She reports she has never had an EKG.  Her blood pressure is well controlled today although she does have a diagnosis of hypertension.  She has never had a heart attack or stroke.  Her father had heart disease in his late 20s.  He lived into his 51s.  She denies any other symptoms.  Her cardiovascular examination is normal.  She is never had any heart surgery.  She is a former smoker of roughly 18 years.  She quit several years ago.  She has exceedingly high HDL cholesterol.  LDL cholesterol 159.  Her symptoms occur whenever she exercises.  Do not occur at rest.  They are bothersome.   She would like to know what is going on.  Her abnormal EKG and chest pain symptoms 4 months ago are worrisome.  Problem List 1. HTN 2. LBBB 3.  Total cholesterol 295, HDL 124, LDL 159, triglycerides 84  Past Medical History: Past Medical History:  Diagnosis Date  . Anxiety and depression   . GERD (gastroesophageal reflux disease)   . Hyperlipidemia   . Hypertension   . Lateral epicondylitis (tennis elbow)    right    Past Surgical History: Past Surgical History:  Procedure Laterality Date  . COLONOSCOPY W/ BIOPSIES  03/06/2010   Dr. Earle Gell  . COLONOSCOPY WITH PROPOFOL N/A 11/18/2015   Procedure: COLONOSCOPY WITH PROPOFOL;  Surgeon: Garlan Fair, MD;  Location: [facility];  Service: Endoscopy;  Laterality: N/A;  . TYMPANOPLASTY    . WRIST ARTHROSCOPY      Current Medications: Current Meds  Medication Sig  . azelastine (OPTIVAR) 0.05 % ophthalmic solution Place 1 drop into the affected eye 2 times a day  . benzonatate (TESSALON) 200 MG capsule Take 1 capsule (200 mg total) by mouth 3 (three) times daily as needed.  . desvenlafaxine (PRISTIQ) 50 MG 24 hr tablet Take 50 mg by mouth daily.  Marland Kitchen desvenlafaxine (PRISTIQ) 50 MG  24 hr tablet TAKE 1 TABLET BY MOUTH ONCE A DAY  . diphenhydrAMINE (BENADRYL) 25 mg capsule Take 50 mg by mouth at bedtime as needed for allergies.   . hydrOXYzine (VISTARIL) 25 MG capsule TAKE 1 CAPSULE BY MOUTH 2 TIMES DAILY AS NEEDED FOR ANXIETY  . lisdexamfetamine (VYVANSE) 40 MG capsule TAKE 1 CAPSULE BY MOUTH EVERY MORNING. MAY FILL ON OR AFTER 04/07/20  . lisdexamfetamine (VYVANSE) 50 MG capsule Take 50 mg by mouth daily.  Marland Kitchen losartan (COZAAR) 50 MG tablet TAKE 1 TABLET BY MOUTH EVERY EVENING  . losartan-hydrochlorothiazide (HYZAAR) 50-12.5 MG per tablet Take 1 tablet by mouth every evening.   . metoprolol tartrate (LOPRESSOR) 50 MG tablet Take 1 tablet by mouth once for procedure.  . Multiple Vitamin (MULTIVITAMIN WITH MINERALS) TABS tablet  Take 1 tablet by mouth daily.     Allergies:    Patient has no known allergies.   Social History: Social History   Socioeconomic History  . Marital status: Single    Spouse name: Not on file  . Number of children: Not on file  . Years of education: Not on file  . Highest education level: Not on file  Occupational History  . Occupation: Retired  Tobacco Use  . Smoking status: Former Smoker    Packs/day: 1.00    Years: 20.00    Pack years: 20.00  . Smokeless tobacco: Never Used  Substance and Sexual Activity  . Alcohol use: Yes    Alcohol/week: 0.0 standard drinks  . Drug use: No  . Sexual activity: Not on file  Other Topics Concern  . Not on file  Social History Narrative   Single, no children    alcohol and substance abuse counselor [facility]   Enjoys going to the [facility]   1-2 alcoholic beverages per day   5 caffeinated beverages daily   07/26/2014   Social Determinants of Health   Financial Resource Strain: Not on file  Food Insecurity: Not on file  Transportation Needs: Not on file  Physical Activity: Not on file  Stress: Not on file  Social Connections: Not on file     Family History: The patient's family history includes Heart disease (age of onset: 24) in her father; Liver cancer in her mother.  ROS:   All other ROS reviewed and negative. Pertinent positives noted in the HPI.     EKGs/Labs/Other Studies Reviewed:   The following studies were personally reviewed by me today:  EKG:  EKG is ordered today.  The ekg ordered today demonstrates normal sinus rhythm, heart rate 61, left bundle branch block noted, and was personally reviewed by me.   Recent Labs: No results found for requested labs within last 8760 hours.   Recent Lipid Panel No results found for: CHOL, TRIG, HDL, CHOLHDL, VLDL, LDLCALC, LDLDIRECT  Physical Exam:   VS:  BP 108/68   Pulse 61   Ht 5\' 6"  (1.676 m)   Wt 115 lb 6.4 oz (52.3 kg)   SpO2 96%   BMI 18.63 kg/m     Wt Readings from Last 3 Encounters:  07/22/20 115 lb 6.4 oz (52.3 kg)  02/06/18 118 lb 3.2 oz (53.6 kg)  11/18/15 120 lb (54.4 kg)    General: Well nourished, well developed, in no acute distress Head: Atraumatic, normal size  Eyes: PEERLA, EOMI  Neck: Supple, no JVD Endocrine: No thryomegaly Cardiac: Normal S1, S2; RRR; no murmurs, rubs, or gallops Lungs: Clear to auscultation bilaterally, no  wheezing, rhonchi or rales  Abd: Soft, nontender, no hepatomegaly  Ext: No edema, pulses 2+ Musculoskeletal: No deformities, BUE and BLE strength normal and equal Skin: Warm and dry, no rashes   Neuro: Alert and oriented to person, place, time, and situation, CNII-XII grossly intact, no focal deficits  Psych: Normal mood and affect   ASSESSMENT:   Tiffany Curtis is a 65 y.o. female who presents for the following: 1. Chest pain of uncertain etiology   2. SOB (shortness of breath) on exertion   3. LBBB (left bundle branch block)     PLAN:   1. Chest pain of uncertain etiology 2. SOB (shortness of breath) on exertion 3. LBBB (left bundle branch block) -She had symptoms of chest pain 4 months ago.  Now she continues to have exercise intolerance with exertional shortness of breath.  Chest pain symptoms attributed to pleurisy and improved with steroids.  EKG in office demonstrates left bundle branch block.  Cardiovascular examination is normal.  I hear no murmurs rubs or gallops.  Her symptoms could be related to underlying obstructive CAD.  CVD risk factors include prior tobacco abuse and strong family history.  She also has exceedingly high good HDL cholesterol.  I wonder if she is in the dysfunctional HDL category.  She does drink alcohol but not in excess.  Her left bundle branch block and symptoms of shortness of breath are concerning.  I recommended a coronary CTA for further evaluation.  She will take 50 mg of metoprolol tartrate charge before the scan.  I would also like to exclude congestive  heart failure given her left bundle branch block.  She has no evidence of this on today's examination.  She will give [place] a BMP, BNP, TSH today as well.  We will also set her up for an echocardiogram.  There is mention of her heart rate increasing with exercise.  She reports this is found on her heart rate monitor.  She denies any symptoms of rapid heartbeat sensation.  I do not suspect arrhythmia at this time.  We can consider a heart monitor if needed if her above work-up is negative.  We will start with this work-up and I will see her back in 3 months.  I will notify her of her results over the phone.  If I need to see her sooner I will.  Disposition: Return in about 3 months (around 10/22/2020).  Medication Adjustments/Labs and Tests Ordered: Current medicines are reviewed at length with the patient today.  Concerns regarding medicines are outlined above.  Orders Placed This Encounter  Procedures  . CT CORONARY MORPH W/CTA COR W/SCORE W/CA W/CM &/OR WO/CM  . TSH  . Basic metabolic panel  . Brain natriuretic peptide  . EKG 12-Lead  . ECHOCARDIOGRAM COMPLETE   Meds ordered this encounter  Medications  . metoprolol tartrate (LOPRESSOR) 50 MG tablet    Sig: Take 1 tablet by mouth once for procedure.    Dispense:  1 tablet    Refill:  0    Patient Instructions  Medication Instructions:  Take Metoprolol 50 mg two hours before CT when scheduled.   *If you need a refill on your cardiac medications before your next appointment, please call your pharmacy*   Lab Work: BMET, BNP, TSH today   If you have labs (blood work) drawn today and your tests are completely normal, you will receive your results only by: Marland Kitchen MyChart Message (if you have MyChart) OR . A paper copy in  the mail If you have any lab test that is abnormal or we need to change your treatment, we will call you to review the results.   Testing/Procedures:  Echocardiogram - Your physician has requested that you have an  echocardiogram. Echocardiography is a painless test that uses sound waves to create images of your heart. It provides your doctor with information about the size and shape of your heart and how well your heart's chambers and valves are working. This procedure takes approximately one hour. There are no restrictions for this procedure. This will be performed at our [facility] location - [address], [address].   Your physician has requested that you have cardiac CT. Cardiac computed tomography (CT) is a painless test that uses an x-ray machine to take clear, detailed pictures of your heart. For further information please visit HugeFiesta.tn. Please follow instruction sheet as given.   Follow-Up: At [facility], you and your health needs are our priority.  As part of our continuing mission to provide you with exceptional heart care, we have created designated Provider Care Teams.  These Care Teams include your primary Cardiologist (physician) and Advanced Practice Providers (APPs -  Physician Assistants and Nurse Practitioners) who all work together to provide you with the care you need, when you need it.  We recommend signing up for the patient portal called "MyChart".  Sign up information is provided on this After Visit Summary.  MyChart is used to connect with patients for Virtual Visits (Telemedicine).  Patients are able to view lab/test results, encounter notes, upcoming appointments, etc.  Non-urgent messages can be sent to your provider as well.   To learn more about what you can do with MyChart, go to [email or web address].    Your next appointment:   3 month(s)  The format for your next appointment:   In Person  Provider:   Eleonore Chiquito, MD   Other Instructions Your cardiac CT will be scheduled at one of the below locations:   [facility] [address] [place], [place] [address] [phone]  If scheduled at [facility], please arrive at the  [facility] main entrance (entrance A) of [facility] 30 minutes prior to test start time. Proceed to the [facility] Radiology Department (first floor) to check-in and test prep.  Please follow these instructions carefully (unless otherwise directed):  Hold all erectile dysfunction medications at least 3 days (72 hrs) prior to test.  On the Night Before the Test: . Be sure to Drink plenty of water. . Do not consume any caffeinated/decaffeinated beverages or chocolate 12 hours prior to your test. . Do not take any antihistamines 12 hours prior to your test.  On the Day of the Test: . Drink plenty of water until 1 hour prior to the test. . Do not eat any food 4 hours prior to the test. . You may take your regular medications prior to the test.  . Take metoprolol (Lopressor) two hours prior to test. . HOLD Furosemide/Hydrochlorothiazide morning of the test. . FEMALES- please wear underwire-free bra if available      After the Test: . Drink plenty of water. . After receiving IV contrast, you may experience a mild flushed feeling. This is normal. . On occasion, you may experience a mild rash up to 24 hours after the test. This is not dangerous. If this occurs, you can take Benadryl 25 mg and increase your fluid intake. . If you experience trouble  breathing, this can be serious. If it is severe call 911 IMMEDIATELY. If it is mild, please call our office. . If you take any of these medications: Glipizide/Metformin, Avandament, Glucavance, please do not take 48 hours after completing test unless otherwise instructed.   Once we have confirmed authorization from your insurance company, we will call you to set up a date and time for your test. Based on how quickly your insurance processes prior authorizations requests, please allow up to 4 weeks to be contacted for scheduling your Cardiac CT appointment. Be advised that routine Cardiac CT appointments could be scheduled as many as 8 weeks  after your provider has ordered it.  For non-scheduling related questions, please contact the cardiac imaging nurse navigator should you have any questions/concerns: Marchia Bond, Cardiac Imaging Nurse Navigator Gordy Clement, Cardiac Imaging Nurse Navigator [facility] Direct Office Dial: [phone]   For scheduling needs, including cancellations and rescheduling, please call [place], [phone].        Signed, Addison Naegeli. Audie Box, MD, [place]  [address], [place], [place] [address] [phone]  07/22/2020 9:40 AM

## 2020-07-22 ENCOUNTER — Other Ambulatory Visit (HOSPITAL_COMMUNITY): Payer: Self-pay

## 2020-07-22 ENCOUNTER — Other Ambulatory Visit: Payer: Self-pay

## 2020-07-22 ENCOUNTER — Ambulatory Visit: Payer: PPO | Admitting: Cardiovascular Disease

## 2020-07-22 ENCOUNTER — Encounter: Payer: Self-pay | Admitting: Cardiovascular Disease

## 2020-07-22 VITALS — BP 108/68 | HR 61 | Ht 66.0 in | Wt 115.4 lb

## 2020-07-22 DIAGNOSIS — R079 Chest pain, unspecified: Secondary | ICD-10-CM | POA: Diagnosis not present

## 2020-07-22 DIAGNOSIS — I447 Left bundle-branch block, unspecified: Secondary | ICD-10-CM | POA: Diagnosis not present

## 2020-07-22 DIAGNOSIS — R0602 Shortness of breath: Secondary | ICD-10-CM

## 2020-07-22 DIAGNOSIS — R5382 Chronic fatigue, unspecified: Secondary | ICD-10-CM

## 2020-07-22 MED ORDER — METOPROLOL TARTRATE 50 MG PO TABS
ORAL_TABLET | ORAL | 0 refills | Status: DC
  Filled 2020-07-22 – 2020-07-30 (×2): qty 1, 1d supply, fill #0

## 2020-07-22 NOTE — Patient Instructions (Addendum)
Medication Instructions:  Take Metoprolol 50 mg two hours before CT when scheduled.   *If you need a refill on your cardiac medications before your next appointment, please call your pharmacy*   Lab Work: BMET, BNP, TSH today   If you have labs (blood work) drawn today and your tests are completely normal, you will receive your results only by: Marland Kitchen MyChart Message (if you have MyChart) OR . A paper copy in the mail If you have any lab test that is abnormal or we need to change your treatment, we will call you to review the results.   Testing/Procedures:  Echocardiogram - Your physician has requested that you have an echocardiogram. Echocardiography is a painless test that uses sound waves to create images of your heart. It provides your doctor with information about the size and shape of your heart and how well your heart's chambers and valves are working. This procedure takes approximately one hour. There are no restrictions for this procedure. This will be performed at our [facility] location - [address], [address].   Your physician has requested that you have cardiac CT. Cardiac computed tomography (CT) is a painless test that uses an x-ray machine to take clear, detailed pictures of your heart. For further information please visit [email or web address]. Please follow instruction sheet as given.   Follow-Up: At [facility], you and your health needs are our priority.  As part of our continuing mission to provide you with exceptional heart care, we have created designated Provider Care Teams.  These Care Teams include your primary Cardiologist (physician) and Advanced Practice Providers (APPs -  Physician Assistants and Nurse Practitioners) who all work together to provide you with the care you need, when you need it.  We recommend signing up for the patient portal called "MyChart".  Sign up information is provided on this After Visit Summary.  MyChart is used to connect with  patients for Virtual Visits (Telemedicine).  Patients are able to view lab/test results, encounter notes, upcoming appointments, etc.  Non-urgent messages can be sent to your provider as well.   To learn more about what you can do with MyChart, go to [email or web address].    Your next appointment:   3 month(s)  The format for your next appointment:   In Person  Provider:   Eleonore Chiquito, MD   Other Instructions Your cardiac CT will be scheduled at one of the below locations:   [facility] [address] [place], [place] [address] [phone]  If scheduled at [facility], please arrive at the [facility] main entrance (entrance A) of [facility] 30 minutes prior to test start time. Proceed to the [facility] Radiology Department (first floor) to check-in and test prep.  Please follow these instructions carefully (unless otherwise directed):  Hold all erectile dysfunction medications at least 3 days (72 hrs) prior to test.  On the Night Before the Test: . Be sure to Drink plenty of water. . Do not consume any caffeinated/decaffeinated beverages or chocolate 12 hours prior to your test. . Do not take any antihistamines 12 hours prior to your test.  On the Day of the Test: . Drink plenty of water until 1 hour prior to the test. . Do not eat any food 4 hours prior to the test. . You may take your regular medications prior to the test.  . Take metoprolol (Lopressor) two hours prior to test. . HOLD Furosemide/Hydrochlorothiazide morning of the test. .  FEMALES- please wear underwire-free bra if available      After the Test: . Drink plenty of water. . After receiving IV contrast, you may experience a mild flushed feeling. This is normal. . On occasion, you may experience a mild rash up to 24 hours after the test. This is not dangerous. If this occurs, you can take Benadryl 25 mg and increase your fluid intake. . If you experience trouble  breathing, this can be serious. If it is severe call 911 IMMEDIATELY. If it is mild, please call our office. . If you take any of these medications: Glipizide/Metformin, Avandament, Glucavance, please do not take 48 hours after completing test unless otherwise instructed.   Once we have confirmed authorization from your insurance company, we will call you to set up a date and time for your test. Based on how quickly your insurance processes prior authorizations requests, please allow up to 4 weeks to be contacted for scheduling your Cardiac CT appointment. Be advised that routine Cardiac CT appointments could be scheduled as many as 8 weeks after your provider has ordered it.  For non-scheduling related questions, please contact the cardiac imaging nurse navigator should you have any questions/concerns: Marchia Bond, Cardiac Imaging Nurse Navigator Gordy Clement, Cardiac Imaging Nurse Navigator [facility] Direct Office Dial: [phone]   For scheduling needs, including cancellations and rescheduling, please call [place], [phone].

## 2020-07-23 LAB — BASIC METABOLIC PANEL
BUN/Creatinine Ratio: 21 (ref 12–28)
BUN: 15 mg/dL (ref 8–27)
CO2: 24 mmol/L (ref 20–29)
Calcium: 9.6 mg/dL (ref 8.7–10.3)
Chloride: 101 mmol/L (ref 96–106)
Creatinine, Ser: 0.72 mg/dL (ref 0.57–1.00)
Glucose: 73 mg/dL (ref 65–99)
Potassium: 4.4 mmol/L (ref 3.5–5.2)
Sodium: 140 mmol/L (ref 134–144)
eGFR: 92 mL/min/{1.73_m2} (ref >59)

## 2020-07-23 LAB — BRAIN NATRIURETIC PEPTIDE: BNP: 38.5 pg/mL (ref 0.0–100.0)

## 2020-07-23 LAB — TSH: TSH: 2.61 u[IU]/mL (ref 0.450–4.500)

## 2020-07-26 ENCOUNTER — Other Ambulatory Visit (HOSPITAL_COMMUNITY): Payer: Self-pay

## 2020-07-26 DIAGNOSIS — J301 Allergic rhinitis due to pollen: Secondary | ICD-10-CM | POA: Diagnosis not present

## 2020-07-26 DIAGNOSIS — J3089 Other allergic rhinitis: Secondary | ICD-10-CM | POA: Diagnosis not present

## 2020-07-26 DIAGNOSIS — J3081 Allergic rhinitis due to animal (cat) (dog) hair and dander: Secondary | ICD-10-CM | POA: Diagnosis not present

## 2020-07-29 ENCOUNTER — Other Ambulatory Visit (HOSPITAL_COMMUNITY): Payer: Self-pay

## 2020-07-30 ENCOUNTER — Other Ambulatory Visit (HOSPITAL_COMMUNITY): Payer: Self-pay

## 2020-07-31 ENCOUNTER — Telehealth (HOSPITAL_COMMUNITY): Payer: Self-pay | Admitting: Emergency Medicine

## 2020-07-31 NOTE — Telephone Encounter (Signed)
Attempted to call patient regarding upcoming cardiac CT appointment. °Left message on voicemail with name and callback number °Khyan Oats RN Navigator Cardiac Imaging °[facility] °[phone] Office °[phone] Cell ° °

## 2020-08-01 ENCOUNTER — Telehealth (HOSPITAL_COMMUNITY): Payer: Self-pay | Admitting: Emergency Medicine

## 2020-08-01 NOTE — Telephone Encounter (Signed)
Reaching out to patient to offer assistance regarding upcoming cardiac imaging study; pt verbalizes understanding of appt date/time, parking situation and where to check in, pre-test NPO status and medications ordered, and verified current allergies; name and call back number provided for further questions should they arise Tiffany Bond RN Navigator Cardiac Imaging [facility] [phone] office [phone] cell  50mg  metoprolol tartrate 2h PTA; holding vyvanse and losartan-HCTZ Tiffany Curtis

## 2020-08-02 ENCOUNTER — Other Ambulatory Visit: Payer: Self-pay

## 2020-08-02 ENCOUNTER — Ambulatory Visit (HOSPITAL_COMMUNITY)
Admission: RE | Admit: 2020-08-02 | Discharge: 2020-08-02 | Disposition: A | Discharge status: Home / Self Care | Payer: PPO | Source: Ambulatory Visit | Attending: Cardiovascular Disease | Admitting: Cardiovascular Disease

## 2020-08-02 DIAGNOSIS — R079 Chest pain, unspecified: Secondary | ICD-10-CM | POA: Insufficient documentation

## 2020-08-02 DIAGNOSIS — I700 Atherosclerosis of aorta: Secondary | ICD-10-CM | POA: Insufficient documentation

## 2020-08-02 MED ORDER — IOHEXOL 350 MG/ML SOLN
95.0000 mL | Freq: Once | INTRAVENOUS | Status: AC | PRN
  Administered 2020-08-02: 95 mL via INTRAVENOUS

## 2020-08-02 MED ORDER — NITROGLYCERIN 0.4 MG SL SUBL
0.8000 mg | SUBLINGUAL_TABLET | Freq: Once | SUBLINGUAL | Status: AC
  Administered 2020-08-02: 0.8 mg via SUBLINGUAL

## 2020-08-02 MED ORDER — NITROGLYCERIN 0.4 MG SL SUBL
SUBLINGUAL_TABLET | SUBLINGUAL | Status: AC
  Filled 2020-08-02: qty 2

## 2020-08-02 NOTE — Progress Notes (Signed)
Patient tolerated CT Cardiac study well, denies headache or feeling light headed.  Patient did have a drop in her BP, denies feeling different.  Patient ambulated with this RN with stable and steady gait.  Taken to the elevator with no issues.  Alert and oriented.  Denied wanting anything to eat and drink prior to discharge.

## 2020-08-12 DIAGNOSIS — J3081 Allergic rhinitis due to animal (cat) (dog) hair and dander: Secondary | ICD-10-CM | POA: Diagnosis not present

## 2020-08-12 DIAGNOSIS — J3089 Other allergic rhinitis: Secondary | ICD-10-CM | POA: Diagnosis not present

## 2020-08-12 DIAGNOSIS — J301 Allergic rhinitis due to pollen: Secondary | ICD-10-CM | POA: Diagnosis not present

## 2020-08-13 ENCOUNTER — Other Ambulatory Visit (HOSPITAL_COMMUNITY): Payer: Self-pay

## 2020-08-13 ENCOUNTER — Other Ambulatory Visit: Payer: Self-pay

## 2020-08-13 ENCOUNTER — Ambulatory Visit (HOSPITAL_COMMUNITY): Payer: PPO | Attending: Internal Medicine

## 2020-08-13 DIAGNOSIS — I447 Left bundle-branch block, unspecified: Secondary | ICD-10-CM | POA: Diagnosis not present

## 2020-08-13 LAB — ECHOCARDIOGRAM COMPLETE
Area-P 1/2: 2.36 cm2
S' Lateral: 2.5 cm

## 2020-08-14 DIAGNOSIS — R0602 Shortness of breath: Secondary | ICD-10-CM

## 2020-08-20 ENCOUNTER — Other Ambulatory Visit (HOSPITAL_COMMUNITY): Payer: Self-pay

## 2020-08-20 MED FILL — Losartan Potassium Tab 50 MG: ORAL | 30 days supply | Qty: 30 | Fill #1 | Status: AC

## 2020-08-21 ENCOUNTER — Other Ambulatory Visit (HOSPITAL_COMMUNITY): Payer: Self-pay

## 2020-08-22 DIAGNOSIS — J301 Allergic rhinitis due to pollen: Secondary | ICD-10-CM | POA: Diagnosis not present

## 2020-08-22 DIAGNOSIS — J3081 Allergic rhinitis due to animal (cat) (dog) hair and dander: Secondary | ICD-10-CM | POA: Diagnosis not present

## 2020-08-22 DIAGNOSIS — J3089 Other allergic rhinitis: Secondary | ICD-10-CM | POA: Diagnosis not present

## 2020-08-23 ENCOUNTER — Other Ambulatory Visit (HOSPITAL_COMMUNITY): Payer: Self-pay

## 2020-08-26 DIAGNOSIS — J301 Allergic rhinitis due to pollen: Secondary | ICD-10-CM | POA: Diagnosis not present

## 2020-08-26 DIAGNOSIS — J3089 Other allergic rhinitis: Secondary | ICD-10-CM | POA: Diagnosis not present

## 2020-08-26 DIAGNOSIS — J3081 Allergic rhinitis due to animal (cat) (dog) hair and dander: Secondary | ICD-10-CM | POA: Diagnosis not present

## 2020-08-27 ENCOUNTER — Other Ambulatory Visit (HOSPITAL_COMMUNITY): Payer: Self-pay

## 2020-08-27 MED ORDER — VYVANSE 40 MG PO CAPS
40.0000 mg | ORAL_CAPSULE | Freq: Every day | ORAL | 0 refills | Status: DC
  Filled 2020-08-27: qty 30, 30d supply, fill #0

## 2020-08-28 ENCOUNTER — Other Ambulatory Visit (HOSPITAL_COMMUNITY): Payer: Self-pay

## 2020-08-30 ENCOUNTER — Other Ambulatory Visit (HOSPITAL_COMMUNITY): Payer: Self-pay

## 2020-09-03 DIAGNOSIS — F331 Major depressive disorder, recurrent, moderate: Secondary | ICD-10-CM | POA: Diagnosis not present

## 2020-09-03 DIAGNOSIS — F900 Attention-deficit hyperactivity disorder, predominantly inattentive type: Secondary | ICD-10-CM | POA: Diagnosis not present

## 2020-09-04 ENCOUNTER — Other Ambulatory Visit (HOSPITAL_COMMUNITY): Payer: Self-pay

## 2020-09-04 MED ORDER — VYVANSE 40 MG PO CAPS
40.0000 mg | ORAL_CAPSULE | Freq: Every day | ORAL | 0 refills | Status: DC
  Filled 2020-12-04: qty 30, 30d supply, fill #0

## 2020-09-04 MED ORDER — DESVENLAFAXINE SUCCINATE ER 50 MG PO TB24
50.0000 mg | ORAL_TABLET | Freq: Every day | ORAL | 1 refills | Status: DC
  Filled 2020-09-04: qty 90, 90d supply, fill #0
  Filled 2021-01-22: qty 90, 90d supply, fill #1

## 2020-09-04 MED ORDER — HYDROXYZINE PAMOATE 25 MG PO CAPS
ORAL_CAPSULE | ORAL | 1 refills | Status: DC
  Filled 2020-09-04: qty 180, 90d supply, fill #0
  Filled 2020-12-02: qty 180, 90d supply, fill #1

## 2020-09-04 MED ORDER — VYVANSE 40 MG PO CAPS
40.0000 mg | ORAL_CAPSULE | Freq: Every day | ORAL | 0 refills | Status: DC
  Filled 2020-09-04 – 2020-09-30 (×2): qty 30, 30d supply, fill #0

## 2020-09-04 MED ORDER — VYVANSE 40 MG PO CAPS
40.0000 mg | ORAL_CAPSULE | Freq: Every day | ORAL | 0 refills | Status: DC
  Filled 2020-11-04: qty 30, 30d supply, fill #0

## 2020-09-09 ENCOUNTER — Other Ambulatory Visit (HOSPITAL_COMMUNITY): Payer: Self-pay

## 2020-09-13 DIAGNOSIS — J3089 Other allergic rhinitis: Secondary | ICD-10-CM | POA: Diagnosis not present

## 2020-09-13 DIAGNOSIS — J301 Allergic rhinitis due to pollen: Secondary | ICD-10-CM | POA: Diagnosis not present

## 2020-09-13 DIAGNOSIS — J3081 Allergic rhinitis due to animal (cat) (dog) hair and dander: Secondary | ICD-10-CM | POA: Diagnosis not present

## 2020-09-25 ENCOUNTER — Other Ambulatory Visit (HOSPITAL_COMMUNITY): Payer: Self-pay

## 2020-09-26 DIAGNOSIS — J3089 Other allergic rhinitis: Secondary | ICD-10-CM | POA: Diagnosis not present

## 2020-09-26 DIAGNOSIS — J3081 Allergic rhinitis due to animal (cat) (dog) hair and dander: Secondary | ICD-10-CM | POA: Diagnosis not present

## 2020-09-26 DIAGNOSIS — J301 Allergic rhinitis due to pollen: Secondary | ICD-10-CM | POA: Diagnosis not present

## 2020-09-30 ENCOUNTER — Other Ambulatory Visit (HOSPITAL_COMMUNITY): Payer: Self-pay

## 2020-09-30 MED FILL — Losartan Potassium Tab 50 MG: ORAL | 30 days supply | Qty: 30 | Fill #2 | Status: AC

## 2020-10-10 DIAGNOSIS — J301 Allergic rhinitis due to pollen: Secondary | ICD-10-CM | POA: Diagnosis not present

## 2020-10-10 DIAGNOSIS — I10 Essential (primary) hypertension: Secondary | ICD-10-CM | POA: Diagnosis not present

## 2020-10-10 DIAGNOSIS — M818 Other osteoporosis without current pathological fracture: Secondary | ICD-10-CM | POA: Diagnosis not present

## 2020-10-10 DIAGNOSIS — J3081 Allergic rhinitis due to animal (cat) (dog) hair and dander: Secondary | ICD-10-CM | POA: Diagnosis not present

## 2020-10-10 DIAGNOSIS — F339 Major depressive disorder, recurrent, unspecified: Secondary | ICD-10-CM | POA: Diagnosis not present

## 2020-10-10 DIAGNOSIS — J3089 Other allergic rhinitis: Secondary | ICD-10-CM | POA: Diagnosis not present

## 2020-10-10 DIAGNOSIS — G4700 Insomnia, unspecified: Secondary | ICD-10-CM | POA: Diagnosis not present

## 2020-10-10 DIAGNOSIS — Z79899 Other long term (current) drug therapy: Secondary | ICD-10-CM | POA: Diagnosis not present

## 2020-10-10 DIAGNOSIS — E7800 Pure hypercholesterolemia, unspecified: Secondary | ICD-10-CM | POA: Diagnosis not present

## 2020-10-10 DIAGNOSIS — F329 Major depressive disorder, single episode, unspecified: Secondary | ICD-10-CM | POA: Diagnosis not present

## 2020-10-10 DIAGNOSIS — F324 Major depressive disorder, single episode, in partial remission: Secondary | ICD-10-CM | POA: Diagnosis not present

## 2020-10-10 DIAGNOSIS — E785 Hyperlipidemia, unspecified: Secondary | ICD-10-CM | POA: Diagnosis not present

## 2020-10-24 NOTE — Progress Notes (Signed)
Cardiology Office Note:   Date:  10/25/2020  NAME:  Tiffany Curtis    MRN: [id number] DOB:  [birth year]   PCP:  Josetta Huddle, MD  Cardiologist:  None  Electrophysiologist:  None   Referring MD: Josetta Huddle, MD   Chief Complaint  Patient presents with   Follow-up   History of Present Illness:   Tiffany Curtis is a 65 y.o. female with a hx of HTN, LBBB who presents for follow-up of CP/SOB. CCTA with minimal CAD. Echo normal. Awaiting CPET.  She reports she is still getting short of breath with heavy exertion.  No chest pain or pressure.  She was treated for pleurisy in January.  Coronary CTA shows minimal CAD which is likely just normal.  She has a calcium score of 0.  Echocardiogram shows normal LV function.  She has a cardiopulmonary exercise test next week.  After that I would recommend pulmonary function testing if no abnormalities found.  She has had a clear change.  Could be vaccine related.  Unclear.  BP 110/64.  Pulse 69.  She has a chronic left bundle branch block but LVEF is normal.  She has normal strength.  Cardiovascular examination is normal.  We did discuss results of her CTA.  I would not recommend aspirin or statin.  She has very high HDL cholesterol.  Overall this is really unclear what is caused her change in symptoms.  I recommend that her heart is very safe for high activity level.  She will continue with exercising.   Problem List 1. HTN 2. LBBB 3.  Total cholesterol 295, HDL 124, LDL 159, triglycerides 84 4. CCTA  -minimal LAD stenosis <25% -CAC score 0  Past Medical History: Past Medical History:  Diagnosis Date   Anxiety and depression    GERD (gastroesophageal reflux disease)    Hyperlipidemia    Hypertension    Lateral epicondylitis (tennis elbow)    right    Past Surgical History: Past Surgical History:  Procedure Laterality Date   COLONOSCOPY W/ BIOPSIES  03/06/2010   Dr. Earle Gell   COLONOSCOPY WITH PROPOFOL N/A 11/18/2015   Procedure: COLONOSCOPY  WITH PROPOFOL;  Surgeon: Garlan Fair, MD;  Location: [facility];  Service: Endoscopy;  Laterality: N/A;   TYMPANOPLASTY     WRIST ARTHROSCOPY      Current Medications: Current Meds  Medication Sig   Calcium Carb-Cholecalciferol (CALCIUM + D3) 600-800 MG-UNIT TABS Take by mouth 2 (two) times daily.   desvenlafaxine (PRISTIQ) 50 MG 24 hr tablet Take 1 tablet by mouth once a day   diphenhydrAMINE (BENADRYL) 25 mg capsule Take 50 mg by mouth at bedtime as needed for allergies.    hydrOXYzine (VISTARIL) 25 MG capsule TAKE 1 CAPSULE BY MOUTH 2 TIMES DAILY AS NEEDED FOR ANXIETY   lisdexamfetamine (VYVANSE) 40 MG capsule TAKE 1 CAPSULE BY MOUTH DAILY   losartan (COZAAR) 50 MG tablet TAKE 1 TABLET BY MOUTH EVERY EVENING   Multiple Vitamin (MULTIVITAMIN WITH MINERALS) TABS tablet Take 1 tablet by mouth daily.     Allergies:    Patient has no known allergies.   Social History: Social History   Socioeconomic History   Marital status: Single    Spouse name: Not on file   Number of children: Not on file   Years of education: Not on file   Highest education level: Not on file  Occupational History   Occupation: Retired  Tobacco Use   Smoking status: Former    Packs/day:  1.00    Years: 20.00    Pack years: 20.00    Types: Cigarettes   Smokeless tobacco: Never  Substance and Sexual Activity   Alcohol use: Yes    Alcohol/week: 0.0 standard drinks   Drug use: No   Sexual activity: Not on file  Other Topics Concern   Not on file  Social History Narrative   Single, no children    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  ROS:   All other ROS reviewed and negative. Pertinent positives noted in the HPI.     EKGs/Labs/Other Studies Reviewed:   The following studies were personally reviewed by me today:  TTE 08/13/2020  1. Global longitudinal strain is -24%. Left ventricular ejection  fraction, by estimation, is 65 to 70%. The left ventricle has normal  function. The left ventricle has no regional wall motion abnormalities.  Left ventricular diastolic parameters are  indeterminate.   2. Right ventricular systolic function is normal. The right ventricular  size is normal. There is normal pulmonary artery systolic pressure.   3. The mitral valve is normal in structure. Trivial mitral valve  regurgitation.   4. The aortic valve is tricuspid. Aortic valve regurgitation is not  visualized. Mild aortic valve sclerosis is present, with no evidence of  aortic valve stenosis.   5. The inferior vena cava is normal in size with greater than 50%  respiratory variability, suggesting right atrial pressure of 3 mmHg.  CCTA 08/02/2020  IMPRESSION: 1. Coronary calcium score of 0. This was 0 percentile for age and sex matched control.   2. Normal coronary origin with right dominance.   3. Proximal non calcified LAD stenosis, 25% stenosis. Tortuous vessels.   4.  Small focal descending aortic atherosclerosis.  Recent Labs: 07/22/2020: BNP 38.5; BUN 15; Creatinine, Ser 0.72; Potassium 4.4; Sodium 140; TSH 2.610   Recent Lipid Panel No results found for: CHOL, TRIG, HDL, CHOLHDL, VLDL, LDLCALC, LDLDIRECT  Physical Exam:   VS:  BP 110/64 (BP Location: Left Arm, Patient Position: Sitting, Cuff Size: Normal)   Pulse 69   Ht '5\' 6"'$  (1.676 m)   Wt 117 lb 6.4 oz (53.3 kg)   SpO2 99%   BMI 18.95 kg/m    Wt Readings from Last 3 Encounters:  10/25/20 117 lb 6.4 oz (53.3 kg)   07/22/20 115 lb 6.4 oz (52.3 kg)  02/06/18 118 lb 3.2 oz (53.6 kg)    General: Well nourished, well developed, in no acute distress Head: Atraumatic, normal size  Eyes: PEERLA, EOMI  Neck: Supple, no JVD Endocrine: No thryomegaly Cardiac: Normal S1, S2; RRR; no murmurs, rubs, or gallops Lungs: Clear to auscultation bilaterally, no wheezing, rhonchi or rales  Abd: Soft, nontender, no hepatomegaly  Ext: No edema, pulses 2+ Musculoskeletal: No deformities, BUE and BLE strength normal and equal Skin: Warm and dry, no rashes   Neuro: Alert and oriented to person, place, time, and situation, CNII-XII grossly  intact, no focal deficits  Psych: Normal mood and affect   ASSESSMENT:   Katiuska Malinski is a 65 y.o. female who presents for the following: 1. SOB (shortness of breath) on exertion   2. Chest pain of uncertain etiology   3. LBBB (left bundle branch block)     PLAN:   1. SOB (shortness of breath) on exertion 2. Chest pain of uncertain etiology -Evaluated in May for shortness of breath with heavy exertion.  Had chest pain symptoms in January 2022.  They were attributed to pleurisy.  She is had decline in her exercise capacity.  She reports heavy exertion gets her short of breath.  Symptoms are still occurring.  Coronary CTA shows no evidence of CAD MI pending.  I think the minimal LAD stenosis is actually just artifact from tortuous LAD.  She has exceedingly high good H DL cholesterol.  I doubt she will have heart issues in the future.  I have not recommended aspirin or statin at this time.  I think she can continue with regular exercise and diet. -Her echocardiogram was normal.  No evidence of LV dysfunction.  She does have left bundle branch block with is not causing her issues. -At this time I do not see a cardiac issue for her symptoms.  She did have pleurisy in January 2022.  Possibly there is a lung issue.  We will see what her cardiopulmonary exercise testing shows.  She may need PFTs.   After this I think I will be out of options. -I have recommended she continue with exercise.  Her heart is safe for this.  3. LBBB (left bundle branch block) -Normal echo.  No issues.  She will see [place] as needed.  Heart is very healthy based on testing.  Coronary CTA normal.  Echocardiogram normal.  Disposition: Return if symptoms worsen or fail to improve.  Medication Adjustments/Labs and Tests Ordered: Current medicines are reviewed at length with the patient today.  Concerns regarding medicines are outlined above.  No orders of the defined types were placed in this encounter.  No orders of the defined types were placed in this encounter.   Patient Instructions   Follow-Up: At [facility], you and your health needs are our priority.  As part of our continuing mission to provide you with exceptional heart care, we have created designated Provider Care Teams.  These Care Teams include your primary Cardiologist (physician) and Advanced Practice Providers (APPs -  Physician Assistants and Nurse Practitioners) who all work together to provide you with the care you need, when you need it.  We recommend signing up for the patient portal called "MyChart".  Sign up information is provided on this After Visit Summary.  MyChart is used to connect with patients for Virtual Visits (Telemedicine).  Patients are able to view lab/test results, encounter notes, upcoming appointments, etc.  Non-urgent messages can be sent to your provider as well.   To learn more about what you can do with MyChart, go to [email or web address].    Your next appointment:    AS NEEDED   Time Spent with Patient: I have spent a total of 25 minutes with patient reviewing hospital notes, telemetry, EKGs, labs and examining the patient as well as establishing an assessment and plan that was discussed with the patient.  > 50% of time was spent in direct patient care.  Signed, Addison Naegeli. Audie Box, MD, [place]  [address], [place], [place]  [id number] [phone]  10/25/2020 10:11 AM

## 2020-10-25 ENCOUNTER — Ambulatory Visit: Payer: PPO | Admitting: Cardiovascular Disease

## 2020-10-25 ENCOUNTER — Other Ambulatory Visit: Payer: Self-pay

## 2020-10-25 ENCOUNTER — Encounter: Payer: Self-pay | Admitting: Cardiovascular Disease

## 2020-10-25 VITALS — BP 110/64 | HR 69 | Ht 66.0 in | Wt 117.4 lb

## 2020-10-25 DIAGNOSIS — I447 Left bundle-branch block, unspecified: Secondary | ICD-10-CM | POA: Diagnosis not present

## 2020-10-25 DIAGNOSIS — R0602 Shortness of breath: Secondary | ICD-10-CM

## 2020-10-25 DIAGNOSIS — J3081 Allergic rhinitis due to animal (cat) (dog) hair and dander: Secondary | ICD-10-CM | POA: Diagnosis not present

## 2020-10-25 DIAGNOSIS — J3089 Other allergic rhinitis: Secondary | ICD-10-CM | POA: Diagnosis not present

## 2020-10-25 DIAGNOSIS — R079 Chest pain, unspecified: Secondary | ICD-10-CM | POA: Diagnosis not present

## 2020-10-25 DIAGNOSIS — J301 Allergic rhinitis due to pollen: Secondary | ICD-10-CM | POA: Diagnosis not present

## 2020-10-25 NOTE — Patient Instructions (Signed)

## 2020-10-29 ENCOUNTER — Other Ambulatory Visit: Payer: Self-pay

## 2020-10-29 ENCOUNTER — Ambulatory Visit (HOSPITAL_COMMUNITY): Payer: PPO | Attending: Cardiovascular Disease

## 2020-10-29 DIAGNOSIS — R0602 Shortness of breath: Secondary | ICD-10-CM

## 2020-10-30 ENCOUNTER — Other Ambulatory Visit (HOSPITAL_COMMUNITY): Payer: Self-pay

## 2020-10-30 MED FILL — Losartan Potassium Tab 50 MG: ORAL | 30 days supply | Qty: 30 | Fill #3 | Status: AC

## 2020-11-04 ENCOUNTER — Other Ambulatory Visit (HOSPITAL_COMMUNITY): Payer: Self-pay

## 2020-11-07 DIAGNOSIS — J301 Allergic rhinitis due to pollen: Secondary | ICD-10-CM | POA: Diagnosis not present

## 2020-11-07 DIAGNOSIS — J3089 Other allergic rhinitis: Secondary | ICD-10-CM | POA: Diagnosis not present

## 2020-11-07 DIAGNOSIS — J3081 Allergic rhinitis due to animal (cat) (dog) hair and dander: Secondary | ICD-10-CM | POA: Diagnosis not present

## 2020-11-16 ENCOUNTER — Emergency Department (HOSPITAL_COMMUNITY)
Admission: EM | Admit: 2020-11-16 | Discharge: 2020-11-16 | Disposition: A | Discharge status: Home / Self Care | Payer: PPO | Attending: Emergency Medicine | Admitting: Emergency Medicine

## 2020-11-16 ENCOUNTER — Emergency Department (HOSPITAL_COMMUNITY): Payer: PPO

## 2020-11-16 ENCOUNTER — Encounter (HOSPITAL_COMMUNITY): Payer: Self-pay | Admitting: Emergency Medicine

## 2020-11-16 ENCOUNTER — Other Ambulatory Visit: Payer: Self-pay

## 2020-11-16 DIAGNOSIS — S0185XA Open bite of other part of head, initial encounter: Secondary | ICD-10-CM

## 2020-11-16 DIAGNOSIS — I10 Essential (primary) hypertension: Secondary | ICD-10-CM | POA: Diagnosis not present

## 2020-11-16 DIAGNOSIS — Z87891 Personal history of nicotine dependence: Secondary | ICD-10-CM | POA: Insufficient documentation

## 2020-11-16 DIAGNOSIS — W540XXA Bitten by dog, initial encounter: Secondary | ICD-10-CM | POA: Diagnosis not present

## 2020-11-16 DIAGNOSIS — T148XXA Other injury of unspecified body region, initial encounter: Secondary | ICD-10-CM

## 2020-11-16 DIAGNOSIS — S0181XA Laceration without foreign body of other part of head, initial encounter: Secondary | ICD-10-CM | POA: Insufficient documentation

## 2020-11-16 MED ORDER — LIDOCAINE HCL (PF) 1 % IJ SOLN
5.0000 mL | Freq: Once | INTRAMUSCULAR | Status: AC
  Administered 2020-11-16: 5 mL
  Filled 2020-11-16: qty 30

## 2020-11-16 MED ORDER — AMOXICILLIN-POT CLAVULANATE 875-125 MG PO TABS: 1.0000 | ORAL_TABLET | Freq: Two times a day (BID) | ORAL | 0 refills | Status: DC

## 2020-11-16 MED ORDER — LIDOCAINE-EPINEPHRINE-TETRACAINE (LET) TOPICAL GEL
3.0000 mL | Freq: Once | TOPICAL | Status: AC
  Administered 2020-11-16: 3 mL via TOPICAL
  Filled 2020-11-16: qty 3

## 2020-11-16 NOTE — Discharge Instructions (Addendum)
Please pick up antibiotics and take as prescribed to cover for infection Return to the ED in 5 days time for suture removal  Return IMMEDIATELY for any signs of infection including redness/swelling around the wound, drainage of pus, fevers > 100.4  Please confirm rabies status of dog with dog owners. Return to the ED if dog is not up to date on it's vaccines.

## 2020-11-16 NOTE — ED Provider Notes (Signed)
[facility] DEPT Provider Note   CSN: [id number] Arrival date & time: 11/16/20  1938     History Chief Complaint  Patient presents with   Animal Bite    Tiffany Curtis is a 65 y.o. female who presents to the ED today with complaint of dog bite to right chin that occurred earlier today. 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 She forgot this and bent down while near the dog and it bit her on the chin. She believes the dog is UTD on it's rabies vaccines. She does not wish to contact the family at this time to confirm. She states they are coming home tomorrow and she will confirm with them then. She is UTD on her tetanus.   The history is provided by the patient and medical records.      Past Medical History:  Diagnosis Date   Anxiety and depression    GERD (gastroesophageal reflux disease)    Hyperlipidemia    Hypertension    Lateral epicondylitis (tennis elbow)    right    There are no problems to display for this patient.   Past Surgical History:  Procedure Laterality Date   COLONOSCOPY W/ BIOPSIES  03/06/2010   Dr. Earle Gell   COLONOSCOPY WITH PROPOFOL N/A 11/18/2015   Procedure: COLONOSCOPY WITH PROPOFOL;  Surgeon: Garlan Fair, MD;  Location: [facility];  Service: Endoscopy;  Laterality: N/A;   TYMPANOPLASTY     WRIST ARTHROSCOPY       OB History   No obstetric history on file.     Family History  Problem Relation Age of Onset   Liver cancer Mother    Heart disease Father 83    Social History   Tobacco Use   Smoking status: Former    Packs/day: 1.00    Years: 20.00    Pack years: 20.00    Types: Cigarettes   Smokeless tobacco: Never  Substance Use Topics   Alcohol use: Yes    Alcohol/week: 0.0 standard drinks   Drug use: No    Home Medications Prior to Admission medications   Medication Sig Start Date End Date Taking? Authorizing Provider   amoxicillin-clavulanate (AUGMENTIN) 875-125 MG tablet Take 1 tablet by mouth every 12 (twelve) hours. 11/16/20  Yes Alroy Bailiff, Cydne Grahn, PA-C  azelastine (OPTIVAR) 0.05 % ophthalmic solution Place 1 drop into the affected eye 2 times a day Patient not taking: Reported on 10/25/2020 07/02/20     benzonatate (TESSALON) 200 MG capsule Take 1 capsule (200 mg total) by mouth 3 (three) times daily as needed. Patient not taking: Reported on 10/25/2020 02/06/18   Shella Maxim, NP  Calcium Carb-Cholecalciferol (CALCIUM + D3) 600-800 MG-UNIT TABS Take by mouth 2 (two) times daily.    [provider]  desvenlafaxine (PRISTIQ) 50 MG 24 hr tablet Take 50 mg by mouth daily. Patient not taking: No sig reported    [provider]  desvenlafaxine (PRISTIQ) 50 MG 24 hr tablet TAKE 1 TABLET BY MOUTH ONCE A DAY Patient not taking: No sig reported 03/08/20 03/08/21  Pauline Good, NP  desvenlafaxine (PRISTIQ) 50 MG 24 hr tablet Take 1 tablet by mouth once a day 09/03/20     diphenhydrAMINE (BENADRYL) 25 mg capsule Take 50 mg by mouth at bedtime as needed for allergies.     [provider]  hydrOXYzine (VISTARIL) 25 MG capsule TAKE 1 CAPSULE BY MOUTH 2 TIMES  DAILY AS NEEDED FOR ANXIETY 06/04/20 06/04/21  Pauline Good, NP  hydrOXYzine (VISTARIL) 25 MG capsule Take 1 capsule by mouth twice a day as needed for anxiety Patient not taking: Reported on 10/25/2020 09/03/20     lisdexamfetamine (VYVANSE) 40 MG capsule TAKE 1 CAPSULE BY MOUTH EVERY MORNING. MAY FILL ON OR AFTER 04/07/20 03/08/20 09/04/20  Pauline Good, NP  lisdexamfetamine (VYVANSE) 40 MG capsule TAKE 1 CAPSULE BY MOUTH DAILY 08/27/20     lisdexamfetamine (VYVANSE) 40 MG capsule TAKE 1 CAPSULE BY MOUTH DAILY 10/02/20     lisdexamfetamine (VYVANSE) 40 MG capsule TAKE 1 CAPSULE BY MOUTH DAILY 09/03/20     lisdexamfetamine (VYVANSE) 40 MG capsule TAKE 1 CAPSULE BY MOUTH DAILY 11/01/20     lisdexamfetamine (VYVANSE) 50 MG capsule Take 50 mg by mouth daily.     [provider]  losartan (COZAAR) 50 MG tablet TAKE 1 TABLET BY MOUTH EVERY EVENING 12/29/19 12/28/20  Josetta Huddle, MD  losartan-hydrochlorothiazide (HYZAAR) 50-12.5 MG per tablet Take 1 tablet by mouth every evening.  Patient not taking: Reported on 10/25/2020    [provider]  metoprolol tartrate (LOPRESSOR) 50 MG tablet Take 1 tablet by mouth once for procedure. 07/22/20   O'NealCassie Freer, MD  Multiple Vitamin (MULTIVITAMIN WITH MINERALS) TABS tablet Take 1 tablet by mouth daily.    [provider]    Allergies    Patient has no known allergies.  Review of Systems   Review of Systems  Constitutional:  Negative for chills and fever.  Musculoskeletal:  Positive for arthralgias.  Skin:  Positive for wound.  All other systems reviewed and are negative.  Physical Exam Updated Vital Signs BP (!) 129/93 (BP Location: Right Arm)   Pulse 62   Temp 98.5 F (36.9 C) (Oral)   Resp 16   Ht '5\' 6"'$  (1.676 m)   Wt 53.1 kg   SpO2 95%   BMI 18.88 kg/m   Physical Exam Vitals and nursing note reviewed.  Constitutional:      Appearance: She is not ill-appearing.  HENT:     Head: Normocephalic.     Comments: T shaped laceration to right chin measuring a total of 2 cm; mild active bleeding.  Eyes:     Conjunctiva/sclera: Conjunctivae normal.  Cardiovascular:     Rate and Rhythm: Normal rate and regular rhythm.  Pulmonary:     Effort: Pulmonary effort is normal.     Breath sounds: Normal breath sounds.  Abdominal:     Palpations: Abdomen is soft.     Tenderness: There is no abdominal tenderness.  Musculoskeletal:     Cervical back: Neck supple.  Skin:    General: Skin is warm and dry.  Neurological:     Mental Status: She is alert.    ED Results / Procedures / Treatments   Labs (all labs ordered are listed, but only abnormal results are displayed) Labs Reviewed - No data to display  EKG None  Radiology DG Facial Bones 1-2 Views  Result  Date: 11/16/2020 CLINICAL DATA:  Dog bite on the right chin. EXAM: FACIAL BONES - 1-2 VIEW COMPARISON:  None. FINDINGS: No acute fracture or dislocation. The visualized paranasal sinuses and mastoid air cells are clear. Soft tissues are unremarkable. IMPRESSION: Negative. Electronically Signed   By: Anner Crete M.D.   On: 11/16/2020 20:58    Procedures .Marland KitchenLaceration Repair  Date/Time: 11/16/2020 9:47 PM Performed by: Eustaquio Maize, PA-C Authorized by: Eustaquio Maize, PA-C  Consent:    Consent obtained:  Verbal   Consent given by:  Patient   Risks discussed:  Infection, pain and poor cosmetic result Universal protocol:    Patient identity confirmed:  Verbally with patient Laceration details:    Location:  Face   Face location:  Chin   Length (cm):  2   Depth (mm):  2 Pre-procedure details:    Preparation:  Patient was prepped and draped in usual sterile fashion Treatment:    Area cleansed with:  Povidone-iodine   Irrigation solution:  Sterile saline   Irrigation method:  Pressure wash Skin repair:    Repair method:  Sutures   Suture size:  5-0   Suture material:  Prolene   Suture technique:  Simple interrupted   Number of sutures:  3 Approximation:    Approximation:  Loose Repair type:    Repair type:  Intermediate Post-procedure details:    Dressing:  Non-adherent dressing   Procedure completion:  Tolerated well, no immediate complications   Medications Ordered in ED Medications  lidocaine-EPINEPHrine-tetracaine (LET) topical gel (has no administration in time range)  lidocaine (PF) (XYLOCAINE) 1 % injection 5 mL (has no administration in time range)    ED Course  I have reviewed the triage vital signs and the nursing notes.  Pertinent labs & imaging results that were available during my care of the patient were reviewed by me and considered in my medical decision making (see chart for details).    MDM Rules/Calculators/A&P                           65  year old female presenting to the ED today with complaint of dog bite to face that occurred earlier today. She believes dog is UTD on rabies. She is UTD on tetanus. On arrival to the ED pt appears to be in NAD. She is noted to have a 2 cm T shaped laceration to the right chin with mild bleeding. She is dog sitting the dog - she does not want to call owners at this time to confirm rabies status. I did have lengthy discussion with her regarding need to confirm to ensure she does not need rabies injection today. She states family is coming home tomorrow and she will confirm with them then and return to the ED if needed at that time. Will plan for irrigation and loose wound closure  Xray negative Sutures placed loosely to face. Pt instructed to return to the ED in 5 days time for suture removal or sooner for signs of infection. She will confirm dog's rabies vaccine tomorrow and return as needed. Stable for discharge home.   This note was prepared using Dragon voice recognition software and may include unintentional dictation errors due to the inherent limitations of voice recognition software.   Final Clinical Impression(s) / ED Diagnoses Final diagnoses:  Bite by animal  Dog bite of face, initial encounter    Rx / DC Orders ED Discharge Orders          Ordered    amoxicillin-clavulanate (AUGMENTIN) 875-125 MG tablet  Every 12 hours        11/16/20 2030             Discharge Instructions      Please pick up antibiotics and take as prescribed to cover for infection Return to the ED in 5 days time for suture removal  Return IMMEDIATELY for any signs of infection  including redness/swelling around the wound, drainage of pus, fevers > 100.4  Please confirm rabies status of dog with dog owners. Return to the ED if dog is not up to date on it's vaccines.        Eustaquio Maize, PA-C 11/16/20 2150    Gareth Morgan, MD 11/18/20 [phone]

## 2020-11-16 NOTE — ED Triage Notes (Addendum)
Patient presents after being bitten by a dog on her right chin just PTA. Patient states she scared the dog and the dog nipped her chin. Patient has a small "T" shaped laceration to the chin. Laceration cleaned and irrigated. Dog does have his shots updated.

## 2020-11-19 DIAGNOSIS — J3089 Other allergic rhinitis: Secondary | ICD-10-CM | POA: Diagnosis not present

## 2020-11-19 DIAGNOSIS — J3081 Allergic rhinitis due to animal (cat) (dog) hair and dander: Secondary | ICD-10-CM | POA: Diagnosis not present

## 2020-11-19 DIAGNOSIS — J301 Allergic rhinitis due to pollen: Secondary | ICD-10-CM | POA: Diagnosis not present

## 2020-11-20 ENCOUNTER — Ambulatory Visit (HOSPITAL_COMMUNITY)
Admission: EM | Admit: 2020-11-20 | Discharge: 2020-11-20 | Disposition: A | Discharge status: Home / Self Care | Payer: PPO

## 2020-11-20 ENCOUNTER — Other Ambulatory Visit: Payer: Self-pay

## 2020-11-20 DIAGNOSIS — W540XXD Bitten by dog, subsequent encounter: Secondary | ICD-10-CM | POA: Diagnosis not present

## 2020-11-20 DIAGNOSIS — S0181XD Laceration without foreign body of other part of head, subsequent encounter: Secondary | ICD-10-CM | POA: Diagnosis not present

## 2020-11-22 DIAGNOSIS — Z4889 Encounter for other specified surgical aftercare: Secondary | ICD-10-CM | POA: Diagnosis not present

## 2020-11-22 DIAGNOSIS — S0181XD Laceration without foreign body of other part of head, subsequent encounter: Secondary | ICD-10-CM | POA: Diagnosis not present

## 2020-12-02 ENCOUNTER — Other Ambulatory Visit (HOSPITAL_COMMUNITY): Payer: Self-pay

## 2020-12-03 DIAGNOSIS — Z79899 Other long term (current) drug therapy: Secondary | ICD-10-CM | POA: Diagnosis not present

## 2020-12-03 DIAGNOSIS — Z0000 Encounter for general adult medical examination without abnormal findings: Secondary | ICD-10-CM | POA: Diagnosis not present

## 2020-12-03 DIAGNOSIS — K222 Esophageal obstruction: Secondary | ICD-10-CM | POA: Diagnosis not present

## 2020-12-03 DIAGNOSIS — F322 Major depressive disorder, single episode, severe without psychotic features: Secondary | ICD-10-CM | POA: Diagnosis not present

## 2020-12-03 DIAGNOSIS — Z1389 Encounter for screening for other disorder: Secondary | ICD-10-CM | POA: Diagnosis not present

## 2020-12-03 DIAGNOSIS — H9190 Unspecified hearing loss, unspecified ear: Secondary | ICD-10-CM | POA: Diagnosis not present

## 2020-12-03 DIAGNOSIS — I10 Essential (primary) hypertension: Secondary | ICD-10-CM | POA: Diagnosis not present

## 2020-12-03 DIAGNOSIS — G4700 Insomnia, unspecified: Secondary | ICD-10-CM | POA: Diagnosis not present

## 2020-12-03 DIAGNOSIS — E2839 Other primary ovarian failure: Secondary | ICD-10-CM | POA: Diagnosis not present

## 2020-12-03 DIAGNOSIS — F909 Attention-deficit hyperactivity disorder, unspecified type: Secondary | ICD-10-CM | POA: Diagnosis not present

## 2020-12-03 DIAGNOSIS — Z9109 Other allergy status, other than to drugs and biological substances: Secondary | ICD-10-CM | POA: Diagnosis not present

## 2020-12-03 DIAGNOSIS — E785 Hyperlipidemia, unspecified: Secondary | ICD-10-CM | POA: Diagnosis not present

## 2020-12-03 DIAGNOSIS — Z7189 Other specified counseling: Secondary | ICD-10-CM | POA: Diagnosis not present

## 2020-12-04 ENCOUNTER — Other Ambulatory Visit (HOSPITAL_COMMUNITY): Payer: Self-pay

## 2020-12-04 MED FILL — Losartan Potassium Tab 50 MG: ORAL | 30 days supply | Qty: 30 | Fill #4 | Status: AC

## 2020-12-05 ENCOUNTER — Other Ambulatory Visit (HOSPITAL_COMMUNITY): Payer: Self-pay

## 2020-12-05 DIAGNOSIS — J3081 Allergic rhinitis due to animal (cat) (dog) hair and dander: Secondary | ICD-10-CM | POA: Diagnosis not present

## 2020-12-05 DIAGNOSIS — F900 Attention-deficit hyperactivity disorder, predominantly inattentive type: Secondary | ICD-10-CM | POA: Diagnosis not present

## 2020-12-05 DIAGNOSIS — J301 Allergic rhinitis due to pollen: Secondary | ICD-10-CM | POA: Diagnosis not present

## 2020-12-05 DIAGNOSIS — F331 Major depressive disorder, recurrent, moderate: Secondary | ICD-10-CM | POA: Diagnosis not present

## 2020-12-05 DIAGNOSIS — J3089 Other allergic rhinitis: Secondary | ICD-10-CM | POA: Diagnosis not present

## 2020-12-05 MED ORDER — HYDROXYZINE PAMOATE 25 MG PO CAPS
ORAL_CAPSULE | ORAL | 1 refills | Status: DC
  Filled 2020-12-05: qty 180, 90d supply, fill #0

## 2020-12-05 MED ORDER — VYVANSE 40 MG PO CAPS
40.0000 mg | ORAL_CAPSULE | Freq: Every day | ORAL | 0 refills | Status: DC
  Filled 2021-01-09: qty 30, 30d supply, fill #0

## 2020-12-05 MED ORDER — VYVANSE 40 MG PO CAPS
40.0000 mg | ORAL_CAPSULE | Freq: Every day | ORAL | 0 refills | Status: DC
  Filled 2021-03-13: qty 30, 30d supply, fill #0

## 2020-12-05 MED ORDER — VYVANSE 40 MG PO CAPS
40.0000 mg | ORAL_CAPSULE | Freq: Every day | ORAL | 0 refills | Status: DC
  Filled 2020-12-05 – 2021-02-10 (×2): qty 30, 30d supply, fill #0

## 2020-12-05 MED ORDER — DESVENLAFAXINE SUCCINATE ER 50 MG PO TB24
50.0000 mg | ORAL_TABLET | Freq: Every day | ORAL | 1 refills | Status: DC
  Filled 2021-04-17: qty 90, 90d supply, fill #0

## 2020-12-12 DIAGNOSIS — J3081 Allergic rhinitis due to animal (cat) (dog) hair and dander: Secondary | ICD-10-CM | POA: Diagnosis not present

## 2020-12-12 DIAGNOSIS — J301 Allergic rhinitis due to pollen: Secondary | ICD-10-CM | POA: Diagnosis not present

## 2020-12-12 DIAGNOSIS — J3089 Other allergic rhinitis: Secondary | ICD-10-CM | POA: Diagnosis not present

## 2020-12-23 DIAGNOSIS — J3089 Other allergic rhinitis: Secondary | ICD-10-CM | POA: Diagnosis not present

## 2020-12-23 DIAGNOSIS — J3081 Allergic rhinitis due to animal (cat) (dog) hair and dander: Secondary | ICD-10-CM | POA: Diagnosis not present

## 2020-12-23 DIAGNOSIS — J301 Allergic rhinitis due to pollen: Secondary | ICD-10-CM | POA: Diagnosis not present

## 2021-01-01 DIAGNOSIS — H1045 Other chronic allergic conjunctivitis: Secondary | ICD-10-CM | POA: Diagnosis not present

## 2021-01-01 DIAGNOSIS — J301 Allergic rhinitis due to pollen: Secondary | ICD-10-CM | POA: Diagnosis not present

## 2021-01-01 DIAGNOSIS — J3089 Other allergic rhinitis: Secondary | ICD-10-CM | POA: Diagnosis not present

## 2021-01-01 DIAGNOSIS — L298 Other pruritus: Secondary | ICD-10-CM | POA: Diagnosis not present

## 2021-01-09 ENCOUNTER — Other Ambulatory Visit (HOSPITAL_COMMUNITY): Payer: Self-pay

## 2021-01-13 DIAGNOSIS — Z1231 Encounter for screening mammogram for malignant neoplasm of breast: Secondary | ICD-10-CM | POA: Diagnosis not present

## 2021-01-14 ENCOUNTER — Other Ambulatory Visit (HOSPITAL_COMMUNITY): Payer: Self-pay

## 2021-01-14 DIAGNOSIS — J3081 Allergic rhinitis due to animal (cat) (dog) hair and dander: Secondary | ICD-10-CM | POA: Diagnosis not present

## 2021-01-14 DIAGNOSIS — J301 Allergic rhinitis due to pollen: Secondary | ICD-10-CM | POA: Diagnosis not present

## 2021-01-14 DIAGNOSIS — J3089 Other allergic rhinitis: Secondary | ICD-10-CM | POA: Diagnosis not present

## 2021-01-14 MED ORDER — LOSARTAN POTASSIUM 50 MG PO TABS
ORAL_TABLET | ORAL | 3 refills | Status: DC
  Filled 2021-01-14: qty 90, 90d supply, fill #0
  Filled 2021-04-16: qty 90, 90d supply, fill #1
  Filled 2021-07-14: qty 90, 90d supply, fill #2
  Filled 2021-09-19: qty 90, 90d supply, fill #3

## 2021-01-15 ENCOUNTER — Other Ambulatory Visit (HOSPITAL_COMMUNITY): Payer: Self-pay

## 2021-01-16 DIAGNOSIS — J3081 Allergic rhinitis due to animal (cat) (dog) hair and dander: Secondary | ICD-10-CM | POA: Diagnosis not present

## 2021-01-16 DIAGNOSIS — J3089 Other allergic rhinitis: Secondary | ICD-10-CM | POA: Diagnosis not present

## 2021-01-16 DIAGNOSIS — J301 Allergic rhinitis due to pollen: Secondary | ICD-10-CM | POA: Diagnosis not present

## 2021-01-20 DIAGNOSIS — J3081 Allergic rhinitis due to animal (cat) (dog) hair and dander: Secondary | ICD-10-CM | POA: Diagnosis not present

## 2021-01-20 DIAGNOSIS — J3089 Other allergic rhinitis: Secondary | ICD-10-CM | POA: Diagnosis not present

## 2021-01-20 DIAGNOSIS — J301 Allergic rhinitis due to pollen: Secondary | ICD-10-CM | POA: Diagnosis not present

## 2021-01-22 ENCOUNTER — Other Ambulatory Visit (HOSPITAL_COMMUNITY): Payer: Self-pay

## 2021-01-29 DIAGNOSIS — J3089 Other allergic rhinitis: Secondary | ICD-10-CM | POA: Diagnosis not present

## 2021-01-29 DIAGNOSIS — J301 Allergic rhinitis due to pollen: Secondary | ICD-10-CM | POA: Diagnosis not present

## 2021-01-29 DIAGNOSIS — J3081 Allergic rhinitis due to animal (cat) (dog) hair and dander: Secondary | ICD-10-CM | POA: Diagnosis not present

## 2021-02-04 DIAGNOSIS — J301 Allergic rhinitis due to pollen: Secondary | ICD-10-CM | POA: Diagnosis not present

## 2021-02-04 DIAGNOSIS — J3081 Allergic rhinitis due to animal (cat) (dog) hair and dander: Secondary | ICD-10-CM | POA: Diagnosis not present

## 2021-02-04 DIAGNOSIS — J3089 Other allergic rhinitis: Secondary | ICD-10-CM | POA: Diagnosis not present

## 2021-02-10 ENCOUNTER — Other Ambulatory Visit (HOSPITAL_COMMUNITY): Payer: Self-pay

## 2021-02-12 DIAGNOSIS — J301 Allergic rhinitis due to pollen: Secondary | ICD-10-CM | POA: Diagnosis not present

## 2021-02-12 DIAGNOSIS — J3081 Allergic rhinitis due to animal (cat) (dog) hair and dander: Secondary | ICD-10-CM | POA: Diagnosis not present

## 2021-02-12 DIAGNOSIS — J3089 Other allergic rhinitis: Secondary | ICD-10-CM | POA: Diagnosis not present

## 2021-02-27 ENCOUNTER — Other Ambulatory Visit (HOSPITAL_COMMUNITY): Payer: Self-pay

## 2021-02-27 DIAGNOSIS — F331 Major depressive disorder, recurrent, moderate: Secondary | ICD-10-CM | POA: Diagnosis not present

## 2021-02-27 DIAGNOSIS — F900 Attention-deficit hyperactivity disorder, predominantly inattentive type: Secondary | ICD-10-CM | POA: Diagnosis not present

## 2021-02-27 MED ORDER — VYVANSE 40 MG PO CAPS: 40.0000 mg | ORAL_CAPSULE | Freq: Every day | ORAL | 0 refills | Status: DC

## 2021-02-27 MED ORDER — VYVANSE 40 MG PO CAPS
40.0000 mg | ORAL_CAPSULE | Freq: Every day | ORAL | 0 refills | Status: DC
  Filled 2021-05-20 – 2021-06-28 (×3): qty 30, 30d supply, fill #0

## 2021-02-27 MED ORDER — HYDROXYZINE PAMOATE 25 MG PO CAPS
25.0000 mg | ORAL_CAPSULE | Freq: Two times a day (BID) | ORAL | 1 refills | Status: DC | PRN
  Filled 2021-02-27 – 2021-03-21 (×2): qty 180, 90d supply, fill #0
  Filled 2021-06-20: qty 180, 90d supply, fill #1

## 2021-02-27 MED ORDER — DESVENLAFAXINE SUCCINATE ER 50 MG PO TB24
50.0000 mg | ORAL_TABLET | Freq: Every day | ORAL | 1 refills | Status: DC
  Filled 2021-02-27: qty 90, 90d supply, fill #0

## 2021-02-27 MED ORDER — VYVANSE 40 MG PO CAPS
40.0000 mg | ORAL_CAPSULE | Freq: Every day | ORAL | 0 refills | Status: DC
  Filled 2021-04-17: qty 30, 30d supply, fill #0

## 2021-02-28 DIAGNOSIS — J3081 Allergic rhinitis due to animal (cat) (dog) hair and dander: Secondary | ICD-10-CM | POA: Diagnosis not present

## 2021-02-28 DIAGNOSIS — J3089 Other allergic rhinitis: Secondary | ICD-10-CM | POA: Diagnosis not present

## 2021-02-28 DIAGNOSIS — J301 Allergic rhinitis due to pollen: Secondary | ICD-10-CM | POA: Diagnosis not present

## 2021-03-07 ENCOUNTER — Other Ambulatory Visit (HOSPITAL_COMMUNITY): Payer: Self-pay

## 2021-03-07 DIAGNOSIS — J301 Allergic rhinitis due to pollen: Secondary | ICD-10-CM | POA: Diagnosis not present

## 2021-03-07 DIAGNOSIS — J3089 Other allergic rhinitis: Secondary | ICD-10-CM | POA: Diagnosis not present

## 2021-03-07 DIAGNOSIS — J3081 Allergic rhinitis due to animal (cat) (dog) hair and dander: Secondary | ICD-10-CM | POA: Diagnosis not present

## 2021-03-13 ENCOUNTER — Other Ambulatory Visit (HOSPITAL_COMMUNITY): Payer: Self-pay

## 2021-03-21 ENCOUNTER — Other Ambulatory Visit (HOSPITAL_COMMUNITY): Payer: Self-pay

## 2021-04-16 ENCOUNTER — Other Ambulatory Visit (HOSPITAL_COMMUNITY): Payer: Self-pay

## 2021-04-17 ENCOUNTER — Other Ambulatory Visit (HOSPITAL_COMMUNITY): Payer: Self-pay

## 2021-04-21 ENCOUNTER — Other Ambulatory Visit (HOSPITAL_COMMUNITY): Payer: Self-pay

## 2021-04-29 ENCOUNTER — Encounter: Payer: Self-pay | Admitting: Physician Assistant

## 2021-04-29 ENCOUNTER — Ambulatory Visit: Payer: PPO | Admitting: Physician Assistant

## 2021-04-29 ENCOUNTER — Other Ambulatory Visit: Payer: Self-pay

## 2021-04-29 DIAGNOSIS — D2239 Melanocytic nevi of other parts of face: Secondary | ICD-10-CM | POA: Diagnosis not present

## 2021-04-29 DIAGNOSIS — D229 Melanocytic nevi, unspecified: Secondary | ICD-10-CM

## 2021-04-29 DIAGNOSIS — C44519 Basal cell carcinoma of skin of other part of trunk: Secondary | ICD-10-CM

## 2021-04-29 DIAGNOSIS — Z808 Family history of malignant neoplasm of other organs or systems: Secondary | ICD-10-CM | POA: Diagnosis not present

## 2021-04-29 DIAGNOSIS — C4491 Basal cell carcinoma of skin, unspecified: Secondary | ICD-10-CM

## 2021-04-29 DIAGNOSIS — Z85828 Personal history of other malignant neoplasm of skin: Secondary | ICD-10-CM | POA: Diagnosis not present

## 2021-04-29 DIAGNOSIS — Z1283 Encounter for screening for malignant neoplasm of skin: Secondary | ICD-10-CM

## 2021-04-29 DIAGNOSIS — L570 Actinic keratosis: Secondary | ICD-10-CM | POA: Diagnosis not present

## 2021-04-29 DIAGNOSIS — D2271 Melanocytic nevi of right lower limb, including hip: Secondary | ICD-10-CM | POA: Diagnosis not present

## 2021-04-29 DIAGNOSIS — D485 Neoplasm of uncertain behavior of skin: Secondary | ICD-10-CM

## 2021-04-29 HISTORY — DX: Melanocytic nevi, unspecified: D22.9

## 2021-04-29 HISTORY — DX: Basal cell carcinoma of skin, unspecified: C44.91

## 2021-04-29 NOTE — Patient Instructions (Signed)

## 2021-05-01 ENCOUNTER — Encounter: Payer: Self-pay | Admitting: Physician Assistant

## 2021-05-01 ENCOUNTER — Telehealth: Payer: Self-pay

## 2021-05-01 ENCOUNTER — Telehealth: Payer: Self-pay | Admitting: Physician Assistant

## 2021-05-01 NOTE — Telephone Encounter (Signed)
-----   Message from Warren Danes, [place] sent at 05/01/2021  8:16 AM EST ----- 30 min curet.Marland KitchenMarland KitchenMarland Kitchen

## 2021-05-01 NOTE — Telephone Encounter (Signed)
Path to patient. Will schedule for surgery.

## 2021-05-01 NOTE — Telephone Encounter (Signed)
Phone call to patient with her pathology results. Patient aware of results.  

## 2021-05-09 ENCOUNTER — Other Ambulatory Visit (HOSPITAL_COMMUNITY): Payer: Self-pay

## 2021-05-09 MED ORDER — PEG 3350-KCL-NA BICARB-NACL 420 G PO SOLR
ORAL | 0 refills | Status: DC
  Filled 2021-05-09: qty 4000, 1d supply, fill #0

## 2021-05-18 ENCOUNTER — Encounter: Payer: Self-pay | Admitting: Physician Assistant

## 2021-05-18 NOTE — Progress Notes (Signed)
New Patient   Subjective  Tiffany Curtis is a 66 y.o. female who presents for the following: Annual Exam (Lesion on right chin- growing after dog bite 6 months ago. Persona history of non mole skin cancer but no melanoma. Family history of melanoma. ).   The following portions of the chart were reviewed this encounter and updated as appropriate:  Tobacco   Allergies   Meds   Problems   Med Hx   Surg Hx   Fam Hx       Objective  Well appearing patient in no apparent distress; mood and affect are within normal limits.  A full examination was performed including scalp, head, eyes, ears, nose, lips, neck, chest, axillae, abdomen, back, buttocks, bilateral upper extremities, bilateral lower extremities, hands, feet, fingers, toes, fingernails, and toenails. All findings within normal limits unless otherwise noted below.  Head to Toe Dyspigmented scars clear.   Left Upper Back Scaly erythematous macule        Right Lateral Plantar Surface Bichromic dark nested macule.        Right Chin Skin toned papule.      Left Parotid Area Erythematous patches with gritty scale.   Assessment & Plan  Encounter for screening for malignant neoplasm of skin Head to Toe  Yearly skin exam  Neoplasm of uncertain behavior of skin (3) Left Upper Back  Skin / nail biopsy Type of biopsy: tangential   Informed consent: discussed and consent obtained   Timeout: patient name, date of birth, surgical site, and procedure verified   Procedure prep:  Patient was prepped and draped in usual sterile fashion (Non sterile) Prep type:  Chlorhexidine Anesthesia: the lesion was anesthetized in a standard fashion   Anesthetic:  1% lidocaine w/ epinephrine 1-100,000 local infiltration Instrument used: flexible razor blade   Outcome: patient tolerated procedure well   Post-procedure details: wound care instructions given    Specimen 1 - Surgical pathology Differential Diagnosis: bcc vs scc  Check  Margins: yes  Right Lateral Plantar Surface  Skin / nail biopsy Type of biopsy: tangential   Informed consent: discussed and consent obtained   Timeout: patient name, date of birth, surgical site, and procedure verified   Procedure prep:  Patient was prepped and draped in usual sterile fashion (Non sterile) Prep type:  Chlorhexidine Anesthesia: the lesion was anesthetized in a standard fashion   Anesthetic:  1% lidocaine w/ epinephrine 1-100,000 local infiltration Instrument used: flexible razor blade   Outcome: patient tolerated procedure well   Post-procedure details: wound care instructions given    Specimen 2 - Surgical pathology Differential Diagnosis: r/o atypia  Check Margins: No  Right Chin  Skin / nail biopsy Type of biopsy: tangential   Informed consent: discussed and consent obtained   Timeout: patient name, date of birth, surgical site, and procedure verified   Procedure prep:  Patient was prepped and draped in usual sterile fashion (Non sterile) Prep type:  Chlorhexidine Anesthesia: the lesion was anesthetized in a standard fashion   Anesthetic:  1% lidocaine w/ epinephrine 1-100,000 local infiltration Instrument used: flexible razor blade   Outcome: patient tolerated procedure well   Post-procedure details: wound care instructions given    Specimen 3 - Surgical pathology Differential Diagnosis: bcc vs scc  Check Margins: No  AK (actinic keratosis) Left Parotid Area  Destruction of lesion - Left Parotid Area Complexity: simple   Destruction method: cryotherapy   Informed consent: discussed and consent obtained   Timeout:  patient  name, date of birth, surgical site, and procedure verified Lesion destroyed using liquid nitrogen: Yes   Cryotherapy cycles:  3 Outcome: patient tolerated procedure well with no complications       I, Duante Arocho, PA-C, have reviewed all documentation's for this visit.  The documentation on 05/18/21 for the exam,  diagnosis, procedures and orders are all accurate and complete.

## 2021-05-20 ENCOUNTER — Other Ambulatory Visit (HOSPITAL_COMMUNITY): Payer: Self-pay

## 2021-05-21 ENCOUNTER — Other Ambulatory Visit (HOSPITAL_COMMUNITY): Payer: Self-pay

## 2021-05-23 ENCOUNTER — Other Ambulatory Visit (HOSPITAL_COMMUNITY): Payer: Self-pay

## 2021-05-26 ENCOUNTER — Other Ambulatory Visit (HOSPITAL_COMMUNITY): Payer: Self-pay

## 2021-05-27 ENCOUNTER — Other Ambulatory Visit (HOSPITAL_COMMUNITY): Payer: Self-pay

## 2021-05-27 MED ORDER — DESVENLAFAXINE SUCCINATE ER 50 MG PO TB24
50.0000 mg | ORAL_TABLET | Freq: Every day | ORAL | 0 refills | Status: DC
  Filled 2021-05-27: qty 90, 90d supply, fill #0
  Filled 2021-07-28: qty 60, 60d supply, fill #0

## 2021-05-27 MED ORDER — HYDROXYZINE PAMOATE 25 MG PO CAPS
ORAL_CAPSULE | ORAL | 1 refills | Status: AC
Start: 2021-05-27 — End: ?
  Filled 2022-03-28 (×2): qty 180, 90d supply, fill #0

## 2021-05-27 MED ORDER — VYVANSE 40 MG PO CAPS
40.0000 mg | ORAL_CAPSULE | Freq: Every day | ORAL | 0 refills | Status: AC
Start: 2021-07-25 — End: ?

## 2021-05-27 MED ORDER — VYVANSE 40 MG PO CAPS: 40.0000 mg | ORAL_CAPSULE | Freq: Every day | ORAL | 0 refills | Status: DC

## 2021-05-27 MED ORDER — VYVANSE 40 MG PO CAPS
40.0000 mg | ORAL_CAPSULE | Freq: Every day | ORAL | 0 refills | Status: DC
  Filled 2021-05-27: qty 30, 30d supply, fill #0

## 2021-05-28 ENCOUNTER — Other Ambulatory Visit (HOSPITAL_COMMUNITY): Payer: Self-pay

## 2021-06-10 ENCOUNTER — Ambulatory Visit (INDEPENDENT_AMBULATORY_CARE_PROVIDER_SITE_OTHER): Payer: PPO | Admitting: Physician Assistant

## 2021-06-10 ENCOUNTER — Encounter: Payer: Self-pay | Admitting: Physician Assistant

## 2021-06-10 ENCOUNTER — Other Ambulatory Visit (HOSPITAL_COMMUNITY): Payer: Self-pay

## 2021-06-10 ENCOUNTER — Other Ambulatory Visit: Payer: Self-pay

## 2021-06-10 DIAGNOSIS — C44519 Basal cell carcinoma of skin of other part of trunk: Secondary | ICD-10-CM | POA: Diagnosis not present

## 2021-06-10 DIAGNOSIS — L570 Actinic keratosis: Secondary | ICD-10-CM

## 2021-06-10 MED ORDER — FLUOROURACIL 5 % EX CREA
TOPICAL_CREAM | CUTANEOUS | 1 refills | Status: DC
Start: 2021-06-10 — End: 2021-07-16
  Filled 2021-06-10: qty 40, 15d supply, fill #0

## 2021-06-10 NOTE — Patient Instructions (Signed)

## 2021-06-11 ENCOUNTER — Encounter: Payer: Self-pay | Admitting: Physician Assistant

## 2021-06-11 NOTE — Progress Notes (Signed)
? ?  Follow-Up Visit ?  ?Subjective  ?Tiffany Curtis is a 66 y.o. female who presents for the following: Procedure (Here for treatment - left upper back- bcc x 1). ? ? ?The following portions of the chart were reviewed this encounter and updated as appropriate:  Tobacco  Allergies  Meds  Problems  Med Hx  Surg Hx  Fam Hx   ?  ? ?Objective  ?Well appearing patient in no apparent distress; mood and affect are within normal limits. ? ?A focused examination was performed including back. Relevant physical exam findings are noted in the Assessment and Plan. ? ?Left Upper Back ?Pink macule ? ?Left Malar Cheek, Right Malar Cheek ?Erythematous patches with gritty scale. ? ? ?Assessment & Plan  ?Basal cell carcinoma (BCC) of skin of trunk ?Left Upper Back ? ?Destruction of lesion ?Complexity: simple   ?Destruction method: cryotherapy   ?Informed consent: discussed and consent obtained   ?Timeout:  patient name, date of birth, surgical site, and procedure verified ?Anesthesia: the lesion was anesthetized in a standard fashion   ?Anesthetic:  1% lidocaine w/ epinephrine 1-100,000 local infiltration ?Lesion destroyed using liquid nitrogen: Yes   ?Region frozen until ice ball extended beyond lesion: Yes   ?Cryotherapy cycles:  3 ?Final wound size (cm):  1.8 ?Hemostasis achieved with:  aluminum chloride ?Outcome: patient tolerated procedure well with no complications   ?Post-procedure details: wound care instructions given   ?Additional details:  Wound inoculated with 5% fluorouracil solution ? ?AK (actinic keratosis) (2) ?Left Malar Cheek; Right Malar Cheek ? ?fluorouracil (EFUDEX) 5 % cream - Left Malar Cheek, Right Malar Cheek ?Apply to face at bedtime  Monday - Sunday for  2 weeks ? ? ? ?I, Rogena Deupree, PA-C, have reviewed all documentation's for this visit.  The documentation on 06/11/21 for the exam, diagnosis, procedures and orders are all accurate and complete. ?

## 2021-06-18 ENCOUNTER — Other Ambulatory Visit (HOSPITAL_COMMUNITY): Payer: Self-pay

## 2021-06-20 ENCOUNTER — Other Ambulatory Visit (HOSPITAL_COMMUNITY): Payer: Self-pay

## 2021-06-28 ENCOUNTER — Other Ambulatory Visit (HOSPITAL_COMMUNITY): Payer: Self-pay

## 2021-07-09 ENCOUNTER — Other Ambulatory Visit (HOSPITAL_COMMUNITY): Payer: Self-pay

## 2021-07-09 MED ORDER — AMOXICILLIN 500 MG PO CAPS
ORAL_CAPSULE | ORAL | 0 refills | Status: DC
  Filled 2021-07-09: qty 28, 7d supply, fill #0

## 2021-07-11 ENCOUNTER — Other Ambulatory Visit (HOSPITAL_COMMUNITY): Payer: Self-pay

## 2021-07-11 MED ORDER — EPINEPHRINE 0.3 MG/0.3ML IJ SOAJ
INTRAMUSCULAR | 1 refills | Status: AC
Start: 2021-07-11 — End: ?
  Filled 2021-07-11: qty 2, 30d supply, fill #0

## 2021-07-14 ENCOUNTER — Other Ambulatory Visit (HOSPITAL_COMMUNITY): Payer: Self-pay

## 2021-07-16 ENCOUNTER — Encounter: Payer: Self-pay | Admitting: Physician Assistant

## 2021-07-16 ENCOUNTER — Ambulatory Visit: Payer: PPO | Admitting: Physician Assistant

## 2021-07-16 DIAGNOSIS — D485 Neoplasm of uncertain behavior of skin: Secondary | ICD-10-CM

## 2021-07-16 NOTE — Patient Instructions (Signed)

## 2021-07-17 ENCOUNTER — Encounter: Payer: Self-pay | Admitting: Physician Assistant

## 2021-07-17 NOTE — Progress Notes (Signed)
? ?  Follow-Up Visit ?  ?Subjective  ?Tiffany Curtis is a 66 y.o. female who presents for the following: Skin Problem (Patient here today for removal of lesion on her right pinky finger. She thinks that she has a splinter or some foreign object because it hurts sometimes.) ? ? ?The following portions of the chart were reviewed this encounter and updated as appropriate:  Tobacco  Allergies  Meds  Problems  Med Hx  Surg Hx  Fam Hx   ?  ? ?Objective  ?Well appearing patient in no apparent distress; mood and affect are within normal limits. ? ?A focused examination was performed including right hand. Relevant physical exam findings are noted in the Assessment and Plan. ? ?Right 5th Finger Tip ?Punctate crust. ? ? ? ? ? ?Assessment & Plan  ?Neoplasm of uncertain behavior of skin ?Right 5th Finger Tip ? ?Skin / nail biopsy ?Type of biopsy: punch   ?Informed consent: discussed and consent obtained   ?Timeout: patient name, date of birth, surgical site, and procedure verified   ?Procedure prep:  Patient was prepped and draped in usual sterile fashion (Non sterile) ?Prep type:  Chlorhexidine ?Anesthesia: the lesion was anesthetized in a standard fashion   ?Anesthetic:  1% lidocaine w/ epinephrine 1-100,000 local infiltration ?Punch size:  2 mm ?Suture size:  4-0 ?Suture type: nylon   ?Hemostasis achieved with: suture   ?Outcome: patient tolerated procedure well   ? ?Specimen 1 - Surgical pathology ?Differential Diagnosis: foreign body  vs PG ? ?Check Margins: No ? ? ? ?I, Elvy Mclarty, PA-C, have reviewed all documentation's for this visit.  The documentation on 07/17/21 for the exam, diagnosis, procedures and orders are all accurate and complete. ?

## 2021-07-28 ENCOUNTER — Other Ambulatory Visit (HOSPITAL_COMMUNITY): Payer: Self-pay

## 2021-08-08 ENCOUNTER — Other Ambulatory Visit (HOSPITAL_COMMUNITY): Payer: Self-pay

## 2021-08-08 MED ORDER — AMOXICILLIN 500 MG PO CAPS
ORAL_CAPSULE | ORAL | 0 refills | Status: DC
  Filled 2021-08-08: qty 25, 6d supply, fill #0

## 2021-08-15 ENCOUNTER — Other Ambulatory Visit (HOSPITAL_COMMUNITY): Payer: Self-pay

## 2021-08-15 MED ORDER — TRIAMCINOLONE ACETONIDE 0.1 % EX CREA
TOPICAL_CREAM | CUTANEOUS | 3 refills | Status: AC
Start: 2021-08-15 — End: ?
  Filled 2021-08-15: qty 90, 30d supply, fill #0

## 2021-08-27 ENCOUNTER — Other Ambulatory Visit (HOSPITAL_COMMUNITY): Payer: Self-pay

## 2021-08-27 MED ORDER — AMPHETAMINE-DEXTROAMPHET ER 20 MG PO CP24
20.0000 mg | ORAL_CAPSULE | Freq: Every day | ORAL | 0 refills | Status: DC
  Filled 2021-08-27: qty 30, 30d supply, fill #0

## 2021-08-27 MED ORDER — DESVENLAFAXINE SUCCINATE ER 50 MG PO TB24
50.0000 mg | ORAL_TABLET | Freq: Every day | ORAL | 0 refills | Status: DC
  Filled 2021-09-19: qty 90, 90d supply, fill #0

## 2021-08-27 MED ORDER — HYDROXYZINE PAMOATE 25 MG PO CAPS
ORAL_CAPSULE | ORAL | 1 refills | Status: AC
Start: 2021-08-26 — End: ?
  Filled 2021-08-27: qty 180, 90d supply, fill #0
  Filled 2021-12-19: qty 180, 90d supply, fill #1

## 2021-09-17 IMAGING — CR DG LUMBAR SPINE 2-3V
3 series · 3 of 3 positions shown · non-contrast
Comparison: None.

CLINICAL DATA: Acute right low back pain with right sciatica for 1
week.

EXAM:
LUMBAR SPINE - 2-3 VIEW

[t l-spine a.p.]
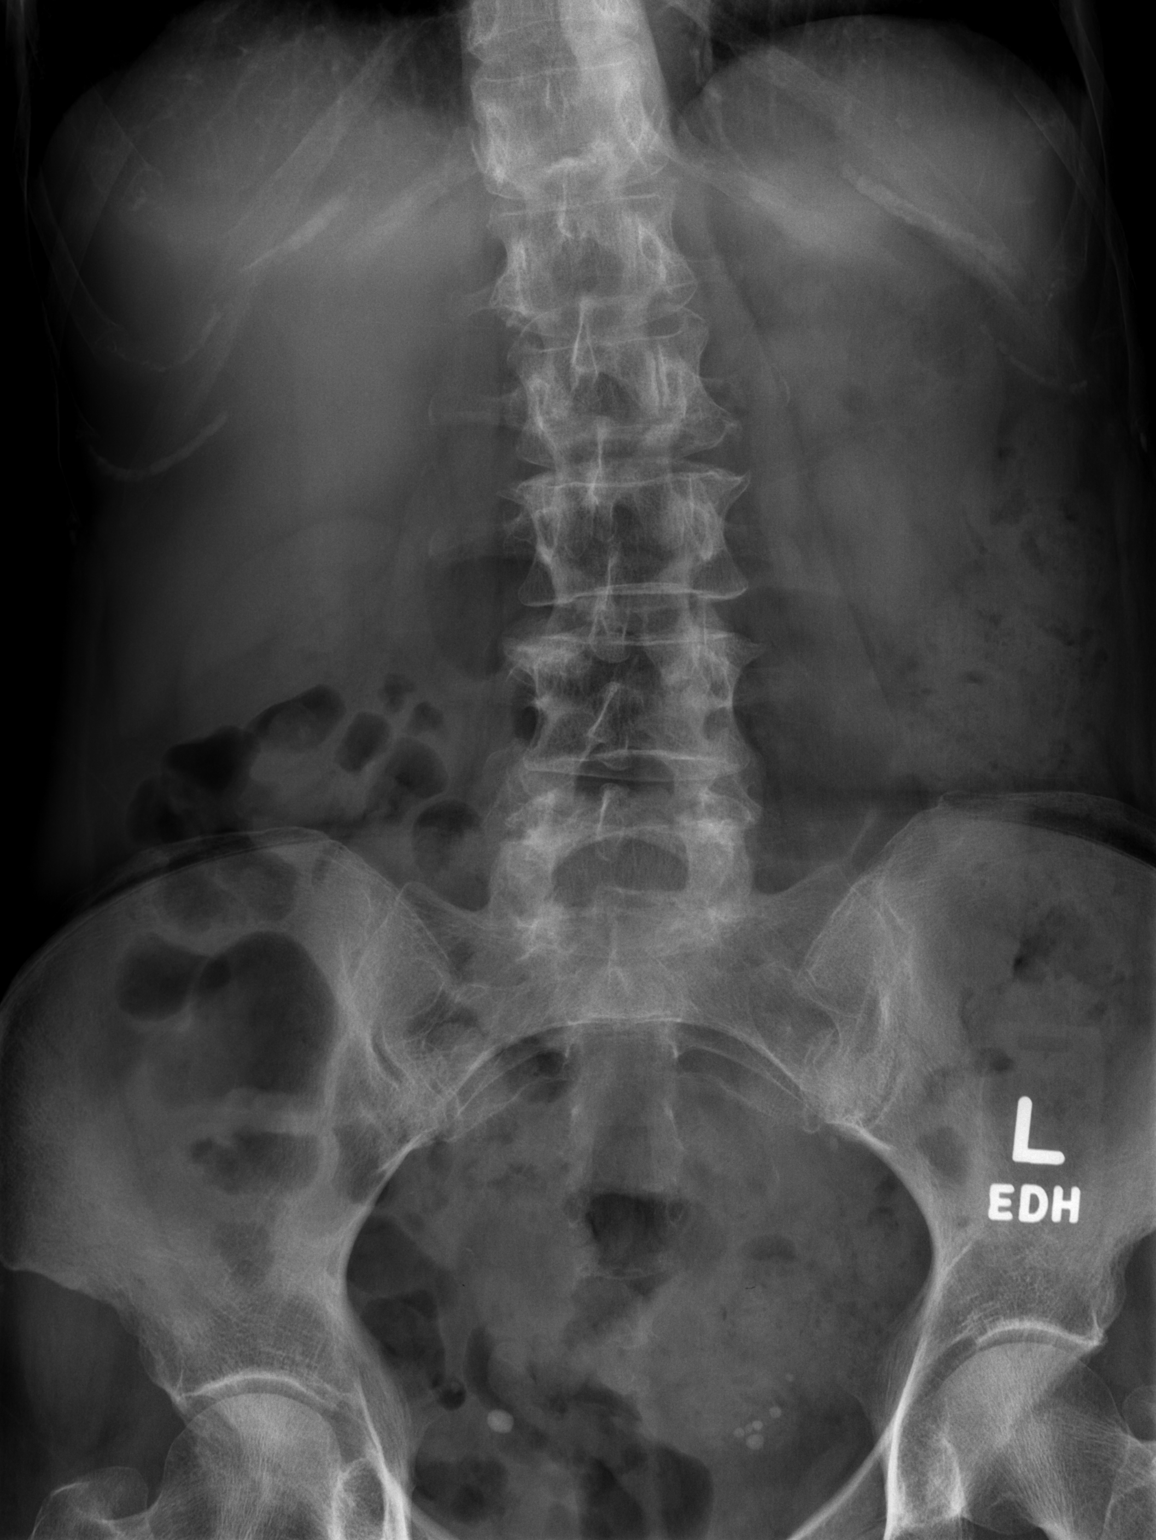

[t l-spine lat]
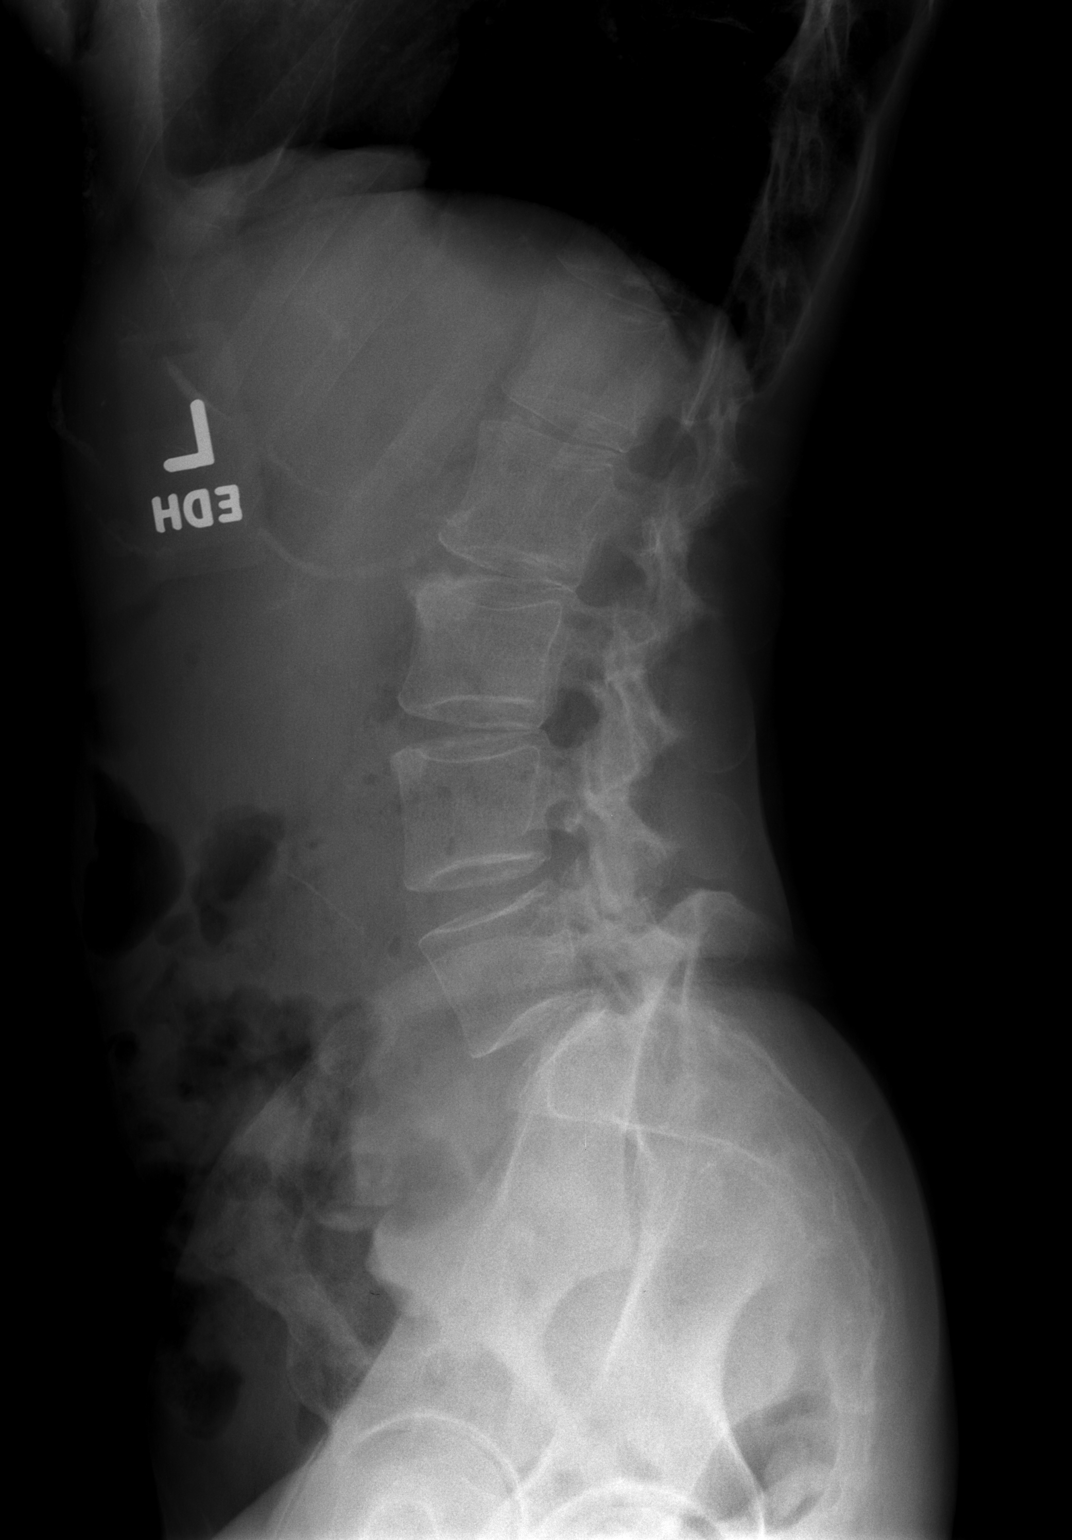

[t l-spine l5-s1 spot]
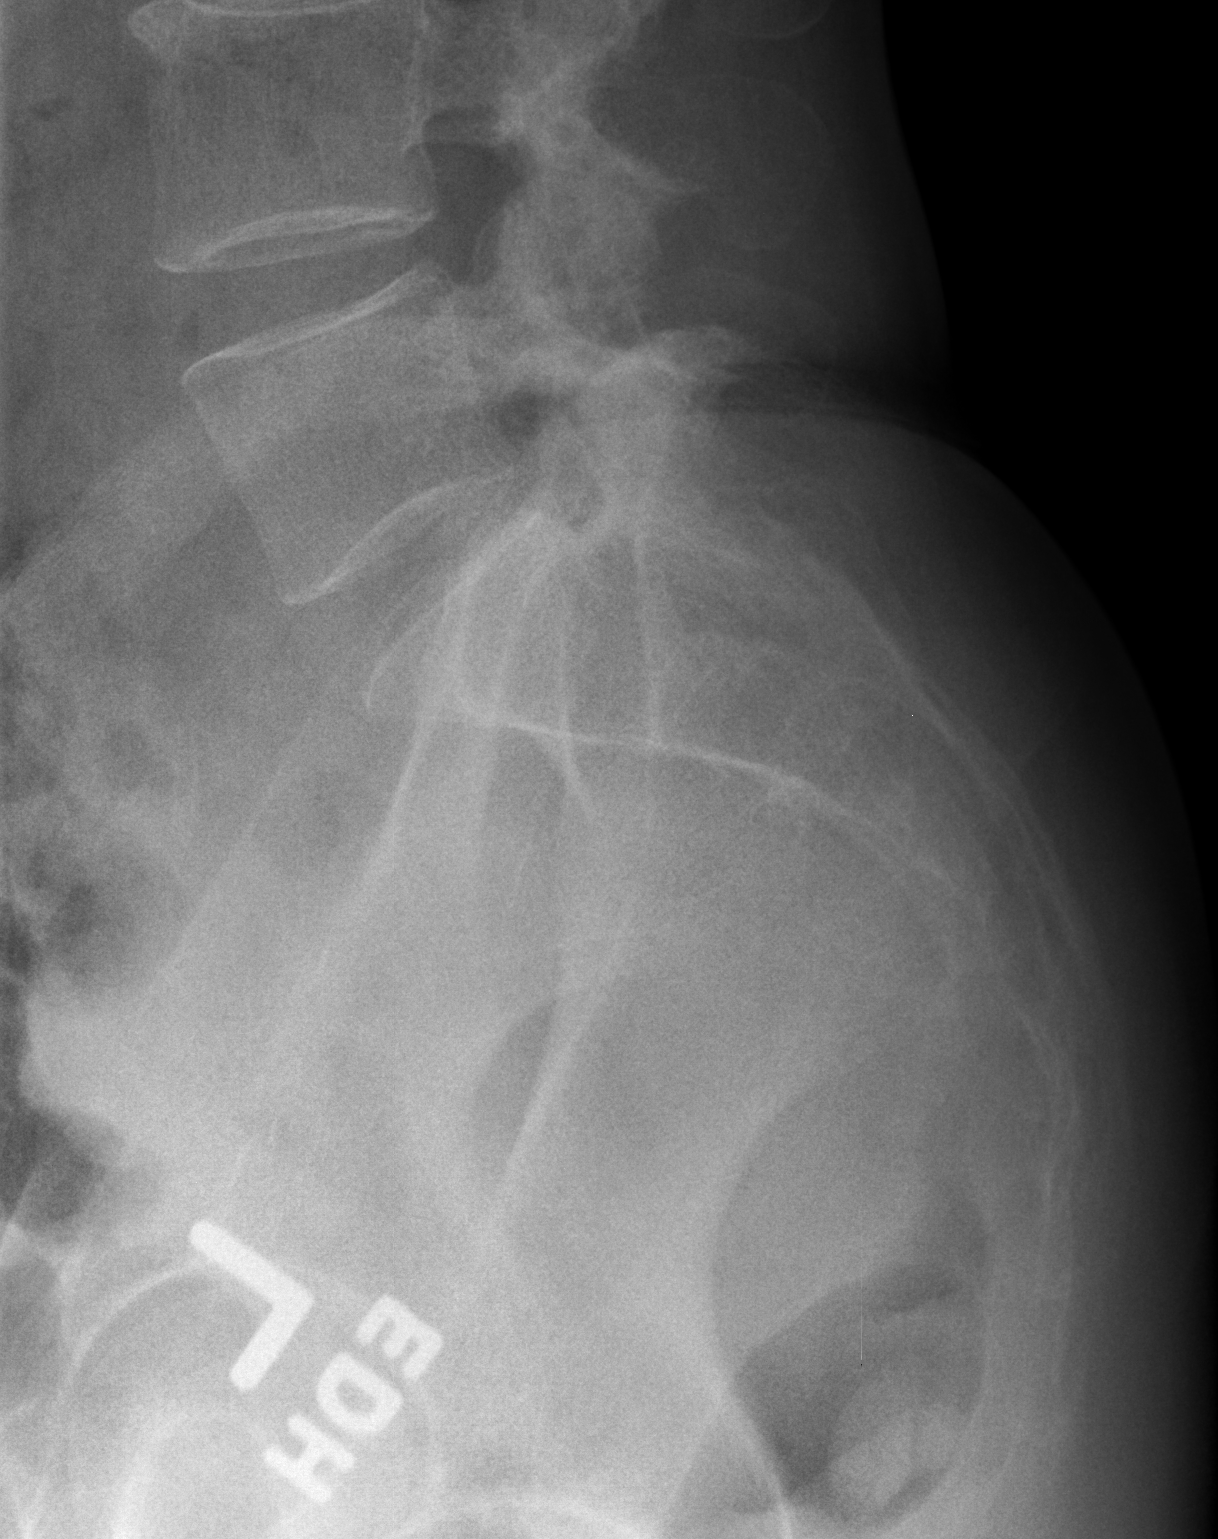

[3 of 3 positions shown; findings below may reference images not displayed]

FINDINGS: Five lumbar type vertebral bodies. Mild lumbar scoliosis convex
towards the left. No anterior subluxation. No vertebral compression.
Mild degenerative changes in the lumbar spine with endplate
hypertrophic changes at multiple levels. Degenerative change in the
lower facet joints. No focal bone lesion or bone destruction.
Visualized sacrum appears intact.
IMPRESSION: Mild degenerative changes in the lumbar spine. No acute displaced
fractures identified.

## 2021-09-19 ENCOUNTER — Other Ambulatory Visit (HOSPITAL_COMMUNITY): Payer: Self-pay

## 2021-09-20 ENCOUNTER — Other Ambulatory Visit (HOSPITAL_COMMUNITY): Payer: Self-pay

## 2021-09-26 ENCOUNTER — Other Ambulatory Visit (HOSPITAL_COMMUNITY): Payer: Self-pay

## 2021-09-29 ENCOUNTER — Other Ambulatory Visit (HOSPITAL_COMMUNITY): Payer: Self-pay

## 2021-09-29 MED ORDER — OXYCODONE HCL 5 MG PO TABS
ORAL_TABLET | ORAL | 0 refills | Status: DC
  Filled 2021-09-29: qty 30, 5d supply, fill #0

## 2021-09-29 MED ORDER — CELECOXIB 100 MG PO CAPS
100.0000 mg | ORAL_CAPSULE | Freq: Two times a day (BID) | ORAL | 2 refills | Status: AC
Start: 2021-09-29 — End: ?
  Filled 2021-09-29: qty 60, 30d supply, fill #0

## 2021-09-29 MED ORDER — ACETAMINOPHEN EXTRA STRENGTH 500 MG PO CAPS
ORAL_CAPSULE | ORAL | 0 refills | Status: DC
Start: 2021-09-29 — End: 2023-02-02

## 2021-09-29 MED ORDER — ONDANSETRON HCL 4 MG PO TABS
ORAL_TABLET | ORAL | 0 refills | Status: AC
Start: 2021-09-29 — End: ?
  Filled 2021-09-29: qty 10, 3d supply, fill #0

## 2021-10-01 ENCOUNTER — Other Ambulatory Visit (HOSPITAL_COMMUNITY): Payer: Self-pay

## 2021-10-06 ENCOUNTER — Other Ambulatory Visit (HOSPITAL_COMMUNITY): Payer: Self-pay

## 2021-10-06 MED ORDER — AMPHETAMINE-DEXTROAMPHET ER 20 MG PO CP24
ORAL_CAPSULE | Freq: Every day | ORAL | 0 refills | Status: DC
  Filled 2021-10-06: qty 30, 30d supply, fill #0

## 2021-10-16 ENCOUNTER — Other Ambulatory Visit (HOSPITAL_COMMUNITY): Payer: Self-pay

## 2021-11-19 ENCOUNTER — Other Ambulatory Visit (HOSPITAL_COMMUNITY): Payer: Self-pay

## 2021-11-19 MED ORDER — AMPHETAMINE-DEXTROAMPHET ER 20 MG PO CP24
20.0000 mg | ORAL_CAPSULE | Freq: Every day | ORAL | 0 refills | Status: DC
  Filled 2021-12-26: qty 30, 30d supply, fill #0

## 2021-11-19 MED ORDER — AMPHETAMINE-DEXTROAMPHET ER 20 MG PO CP24
20.0000 mg | ORAL_CAPSULE | Freq: Every day | ORAL | 0 refills | Status: DC
Start: 2022-01-16 — End: 2024-02-08
  Filled 2022-03-12: qty 30, 30d supply, fill #0

## 2021-11-19 MED ORDER — AMPHETAMINE-DEXTROAMPHET ER 20 MG PO CP24
ORAL_CAPSULE | Freq: Every day | ORAL | 0 refills | Status: DC
  Filled 2021-11-19 (×2): qty 30, 30d supply, fill #0

## 2021-11-19 MED ORDER — DESVENLAFAXINE SUCCINATE ER 50 MG PO TB24
50.0000 mg | ORAL_TABLET | Freq: Every day | ORAL | 3 refills | Status: DC
  Filled 2021-11-19 – 2021-12-26 (×2): qty 90, 90d supply, fill #0

## 2021-11-20 ENCOUNTER — Other Ambulatory Visit (HOSPITAL_COMMUNITY): Payer: Self-pay

## 2021-11-21 ENCOUNTER — Other Ambulatory Visit (HOSPITAL_COMMUNITY): Payer: Self-pay

## 2021-12-16 ENCOUNTER — Other Ambulatory Visit: Payer: Self-pay | Admitting: Internal Medicine

## 2021-12-16 DIAGNOSIS — E7800 Pure hypercholesterolemia, unspecified: Secondary | ICD-10-CM

## 2021-12-19 ENCOUNTER — Other Ambulatory Visit (HOSPITAL_COMMUNITY): Payer: Self-pay

## 2021-12-23 ENCOUNTER — Other Ambulatory Visit (HOSPITAL_COMMUNITY): Payer: Self-pay

## 2021-12-24 ENCOUNTER — Ambulatory Visit
Admission: RE | Admit: 2021-12-24 | Discharge: 2021-12-24 | Disposition: A | Discharge status: Home / Self Care | Payer: No Typology Code available for payment source | Source: Ambulatory Visit | Attending: Internal Medicine | Admitting: Internal Medicine

## 2021-12-24 DIAGNOSIS — E7800 Pure hypercholesterolemia, unspecified: Secondary | ICD-10-CM

## 2021-12-26 ENCOUNTER — Other Ambulatory Visit (HOSPITAL_COMMUNITY): Payer: Self-pay

## 2022-01-20 ENCOUNTER — Other Ambulatory Visit (HOSPITAL_COMMUNITY): Payer: Self-pay

## 2022-01-21 ENCOUNTER — Other Ambulatory Visit (HOSPITAL_COMMUNITY): Payer: Self-pay

## 2022-01-21 MED ORDER — LOSARTAN POTASSIUM 50 MG PO TABS
50.0000 mg | ORAL_TABLET | Freq: Every day | ORAL | 3 refills | Status: DC
  Filled 2022-01-21: qty 90, 90d supply, fill #0
  Filled 2022-04-18 (×2): qty 90, 90d supply, fill #1

## 2022-02-05 ENCOUNTER — Other Ambulatory Visit (HOSPITAL_COMMUNITY): Payer: Self-pay

## 2022-02-05 MED ORDER — DESVENLAFAXINE SUCCINATE ER 50 MG PO TB24
50.0000 mg | ORAL_TABLET | Freq: Every day | ORAL | 3 refills | Status: DC
  Filled 2022-03-30: qty 90, 90d supply, fill #0
  Filled 2022-06-29: qty 90, 90d supply, fill #1
  Filled 2022-09-28: qty 90, 90d supply, fill #2
  Filled 2022-12-30: qty 90, 90d supply, fill #3

## 2022-02-05 MED ORDER — AMPHETAMINE-DEXTROAMPHET ER 20 MG PO CP24
20.0000 mg | ORAL_CAPSULE | Freq: Every day | ORAL | 0 refills | Status: DC
  Filled 2022-05-15: qty 30, 30d supply, fill #0

## 2022-02-05 MED ORDER — AMPHETAMINE-DEXTROAMPHET ER 20 MG PO CP24
20.0000 mg | ORAL_CAPSULE | Freq: Every day | ORAL | 0 refills | Status: DC
Start: 2022-03-06 — End: 2024-02-08
  Filled 2022-04-14: qty 30, 30d supply, fill #0

## 2022-02-05 MED ORDER — AMPHETAMINE-DEXTROAMPHET ER 20 MG PO CP24
20.0000 mg | ORAL_CAPSULE | Freq: Every day | ORAL | 0 refills | Status: AC
Start: 2022-02-05 — End: ?
  Filled 2022-02-05: qty 30, 30d supply, fill #0

## 2022-02-05 MED ORDER — DESVENLAFAXINE SUCCINATE ER 50 MG PO TB24: 50.0000 mg | ORAL_TABLET | Freq: Every day | ORAL | 3 refills | Status: DC

## 2022-03-12 ENCOUNTER — Other Ambulatory Visit (HOSPITAL_COMMUNITY): Payer: Self-pay

## 2022-03-12 MED ORDER — DOXYCYCLINE MONOHYDRATE 100 MG PO CAPS
100.0000 mg | ORAL_CAPSULE | Freq: Two times a day (BID) | ORAL | 0 refills | Status: DC
  Filled 2022-03-12: qty 10, 5d supply, fill #0

## 2022-03-12 MED ORDER — HYDROCODONE BIT-HOMATROP MBR 5-1.5 MG/5ML PO SOLN
5.0000 mL | Freq: Two times a day (BID) | ORAL | 0 refills | Status: DC | PRN
  Filled 2022-03-12: qty 70, 7d supply, fill #0

## 2022-03-17 ENCOUNTER — Other Ambulatory Visit (HOSPITAL_COMMUNITY): Payer: Self-pay

## 2022-03-25 ENCOUNTER — Other Ambulatory Visit (HOSPITAL_COMMUNITY): Payer: Self-pay

## 2022-03-28 ENCOUNTER — Other Ambulatory Visit (HOSPITAL_COMMUNITY): Payer: Self-pay

## 2022-03-30 ENCOUNTER — Other Ambulatory Visit (HOSPITAL_COMMUNITY): Payer: Self-pay

## 2022-03-30 ENCOUNTER — Other Ambulatory Visit: Payer: Self-pay

## 2022-03-31 ENCOUNTER — Other Ambulatory Visit (HOSPITAL_COMMUNITY): Payer: Self-pay

## 2022-03-31 MED ORDER — PREDNISONE 20 MG PO TABS
ORAL_TABLET | ORAL | 0 refills | Status: AC
Start: 2022-03-31 — End: ?
  Filled 2022-03-31: qty 10, 5d supply, fill #0

## 2022-03-31 MED ORDER — AZITHROMYCIN 250 MG PO TABS
ORAL_TABLET | ORAL | 0 refills | Status: DC
  Filled 2022-03-31: qty 6, 5d supply, fill #0

## 2022-04-09 ENCOUNTER — Other Ambulatory Visit (HOSPITAL_COMMUNITY): Payer: Self-pay

## 2022-04-09 MED ORDER — HYDROXYZINE PAMOATE 25 MG PO CAPS
ORAL_CAPSULE | ORAL | 1 refills | Status: AC
Start: 2022-04-08 — End: ?
  Filled 2022-04-09: qty 180, 90d supply, fill #0

## 2022-04-14 ENCOUNTER — Other Ambulatory Visit (HOSPITAL_COMMUNITY): Payer: Self-pay

## 2022-04-18 ENCOUNTER — Other Ambulatory Visit (HOSPITAL_COMMUNITY): Payer: Self-pay

## 2022-05-08 ENCOUNTER — Other Ambulatory Visit (HOSPITAL_COMMUNITY): Payer: Self-pay

## 2022-05-08 MED ORDER — AMPHETAMINE-DEXTROAMPHET ER 20 MG PO CP24: 20.0000 mg | ORAL_CAPSULE | Freq: Every day | ORAL | 0 refills | Status: DC

## 2022-05-08 MED ORDER — DESVENLAFAXINE SUCCINATE ER 50 MG PO TB24
50.0000 mg | ORAL_TABLET | Freq: Every day | ORAL | 3 refills | Status: DC
  Filled 2022-05-08: qty 90, 90d supply, fill #0

## 2022-05-08 MED ORDER — AMPHETAMINE-DEXTROAMPHET ER 20 MG PO CP24
20.0000 mg | ORAL_CAPSULE | Freq: Every day | ORAL | 0 refills | Status: DC
  Filled 2022-07-16: qty 30, 30d supply, fill #0

## 2022-05-08 MED ORDER — HYDROXYZINE PAMOATE 25 MG PO CAPS
25.0000 mg | ORAL_CAPSULE | Freq: Two times a day (BID) | ORAL | 1 refills | Status: AC | PRN
Start: 2022-05-07 — End: ?
  Filled 2022-05-08: qty 180, 90d supply, fill #0

## 2022-05-15 ENCOUNTER — Other Ambulatory Visit (HOSPITAL_COMMUNITY): Payer: Self-pay

## 2022-05-19 ENCOUNTER — Ambulatory Visit: Payer: PPO | Admitting: Physician Assistant

## 2022-06-22 ENCOUNTER — Other Ambulatory Visit (HOSPITAL_COMMUNITY): Payer: Self-pay

## 2022-07-02 ENCOUNTER — Other Ambulatory Visit (HOSPITAL_COMMUNITY): Payer: Self-pay

## 2022-07-02 MED ORDER — LOSARTAN POTASSIUM 100 MG PO TABS
100.0000 mg | ORAL_TABLET | Freq: Every day | ORAL | 3 refills | Status: DC
  Filled 2022-07-02: qty 90, 90d supply, fill #0
  Filled 2022-09-12: qty 90, 90d supply, fill #1
  Filled 2022-12-15: qty 90, 90d supply, fill #2
  Filled 2023-03-18: qty 90, 90d supply, fill #3

## 2022-07-02 MED ORDER — ROSUVASTATIN CALCIUM 5 MG PO TABS
5.0000 mg | ORAL_TABLET | Freq: Every day | ORAL | 2 refills | Status: DC
  Filled 2022-07-02: qty 30, 30d supply, fill #0
  Filled 2022-08-06: qty 30, 30d supply, fill #1
  Filled 2022-09-03: qty 30, 30d supply, fill #2

## 2022-07-03 ENCOUNTER — Other Ambulatory Visit (HOSPITAL_COMMUNITY): Payer: Self-pay

## 2022-07-04 ENCOUNTER — Other Ambulatory Visit (HOSPITAL_COMMUNITY): Payer: Self-pay

## 2022-07-06 ENCOUNTER — Other Ambulatory Visit (HOSPITAL_COMMUNITY): Payer: Self-pay

## 2022-07-06 MED ORDER — HYDROXYZINE PAMOATE 25 MG PO CAPS
ORAL_CAPSULE | ORAL | 1 refills | Status: AC
Start: 2022-07-06 — End: ?
  Filled 2022-07-06: qty 180, 90d supply, fill #0
  Filled 2022-10-12: qty 180, 90d supply, fill #1

## 2022-07-16 ENCOUNTER — Other Ambulatory Visit (HOSPITAL_COMMUNITY): Payer: Self-pay

## 2022-07-17 ENCOUNTER — Other Ambulatory Visit (HOSPITAL_COMMUNITY): Payer: Self-pay

## 2022-07-22 ENCOUNTER — Other Ambulatory Visit (HOSPITAL_COMMUNITY): Payer: Self-pay

## 2022-08-04 ENCOUNTER — Encounter (HOSPITAL_BASED_OUTPATIENT_CLINIC_OR_DEPARTMENT_OTHER): Payer: Self-pay

## 2022-08-06 ENCOUNTER — Encounter (HOSPITAL_BASED_OUTPATIENT_CLINIC_OR_DEPARTMENT_OTHER): Payer: PPO

## 2022-08-06 ENCOUNTER — Encounter (HOSPITAL_BASED_OUTPATIENT_CLINIC_OR_DEPARTMENT_OTHER): Payer: Self-pay

## 2022-08-06 ENCOUNTER — Other Ambulatory Visit (HOSPITAL_COMMUNITY): Payer: Self-pay

## 2022-08-06 MED ORDER — AMPHETAMINE-DEXTROAMPHET ER 20 MG PO CP24
20.0000 mg | ORAL_CAPSULE | Freq: Every day | ORAL | 0 refills | Status: DC
  Filled 2022-08-13 – 2022-08-14 (×2): qty 30, 30d supply, fill #0

## 2022-08-06 MED ORDER — AMPHETAMINE-DEXTROAMPHET ER 20 MG PO CP24
20.0000 mg | ORAL_CAPSULE | Freq: Every day | ORAL | 0 refills | Status: DC
  Filled 2022-09-14: qty 30, 30d supply, fill #0

## 2022-08-06 MED ORDER — HYDROXYZINE PAMOATE 25 MG PO CAPS
25.0000 mg | ORAL_CAPSULE | Freq: Two times a day (BID) | ORAL | 1 refills | Status: AC | PRN
Start: 2022-08-06 — End: ?

## 2022-08-06 MED ORDER — AMPHETAMINE-DEXTROAMPHET ER 20 MG PO CP24
20.0000 mg | ORAL_CAPSULE | Freq: Every day | ORAL | 0 refills | Status: DC
  Filled 2022-10-13: qty 30, 30d supply, fill #0

## 2022-08-06 MED ORDER — DESVENLAFAXINE SUCCINATE ER 50 MG PO TB24: 50.0000 mg | ORAL_TABLET | Freq: Every day | ORAL | 3 refills | Status: DC

## 2022-08-07 ENCOUNTER — Other Ambulatory Visit (HOSPITAL_COMMUNITY): Payer: Self-pay

## 2022-08-07 MED ORDER — CYCLOBENZAPRINE HCL 10 MG PO TABS
10.0000 mg | ORAL_TABLET | Freq: Three times a day (TID) | ORAL | 0 refills | Status: DC | PRN
  Filled 2022-08-07: qty 10, 4d supply, fill #0

## 2022-08-13 ENCOUNTER — Other Ambulatory Visit (HOSPITAL_COMMUNITY): Payer: Self-pay

## 2022-08-14 ENCOUNTER — Other Ambulatory Visit (HOSPITAL_COMMUNITY): Payer: Self-pay

## 2022-08-14 ENCOUNTER — Other Ambulatory Visit: Payer: Self-pay

## 2022-08-25 ENCOUNTER — Other Ambulatory Visit: Payer: Self-pay

## 2022-08-25 DIAGNOSIS — R0609 Other forms of dyspnea: Secondary | ICD-10-CM

## 2022-08-26 ENCOUNTER — Ambulatory Visit (INDEPENDENT_AMBULATORY_CARE_PROVIDER_SITE_OTHER): Payer: PPO | Admitting: Internal Medicine

## 2022-08-26 DIAGNOSIS — R0609 Other forms of dyspnea: Secondary | ICD-10-CM

## 2022-08-26 LAB — PULMONARY FUNCTION TEST
DL/VA % pred: 90 %
DL/VA: 3.68 ml/min/mmHg/L
DLCO cor % pred: 91 %
DLCO cor: 19.11 ml/min/mmHg
DLCO unc % pred: 86 %
DLCO unc: 18.04 ml/min/mmHg
FEF 25-75 Post: 1.97 L/sec
FEF 25-75 Pre: 1.43 L/sec
FEF2575-%Change-Post: 37 %
FEF2575-%Pred-Post: 93 %
FEF2575-%Pred-Pre: 67 %
FEV1-%Change-Post: 7 %
FEV1-%Pred-Post: 95 %
FEV1-%Pred-Pre: 88 %
FEV1-Post: 2.4 L
FEV1-Pre: 2.23 L
FEV1FVC-%Change-Post: 2 %
FEV1FVC-%Pred-Pre: 91 %
FEV6-%Change-Post: 4 %
FEV6-%Pred-Post: 105 %
FEV6-%Pred-Pre: 100 %
FEV6-Post: 3.34 L
FEV6-Pre: 3.19 L
FEV6FVC-%Change-Post: 0 %
FEV6FVC-%Pred-Post: 103 %
FEV6FVC-%Pred-Pre: 104 %
FVC-%Change-Post: 5 %
FVC-%Pred-Post: 101 %
FVC-%Pred-Pre: 96 %
FVC-Post: 3.36 L
FVC-Pre: 3.19 L
Post FEV1/FVC ratio: 71 %
Post FEV6/FVC ratio: 99 %
Pre FEV1/FVC ratio: 70 %
Pre FEV6/FVC Ratio: 100 %
RV % pred: 141 %
RV: 3.18 L
TLC % pred: 119 %
TLC: 6.38 L

## 2022-08-26 NOTE — Progress Notes (Signed)
Full PFT completed today ? ?

## 2022-09-03 ENCOUNTER — Other Ambulatory Visit: Payer: Self-pay

## 2022-09-03 ENCOUNTER — Other Ambulatory Visit (HOSPITAL_COMMUNITY): Payer: Self-pay

## 2022-09-05 ENCOUNTER — Other Ambulatory Visit (HOSPITAL_COMMUNITY): Payer: Self-pay

## 2022-09-07 ENCOUNTER — Encounter (HOSPITAL_BASED_OUTPATIENT_CLINIC_OR_DEPARTMENT_OTHER): Payer: PPO

## 2022-09-07 ENCOUNTER — Other Ambulatory Visit (HOSPITAL_COMMUNITY): Payer: Self-pay

## 2022-09-07 MED ORDER — CYCLOBENZAPRINE HCL 10 MG PO TABS
10.0000 mg | ORAL_TABLET | Freq: Three times a day (TID) | ORAL | 0 refills | Status: AC | PRN
Start: 2022-09-07 — End: ?
  Filled 2022-09-07: qty 10, 4d supply, fill #0

## 2022-09-14 ENCOUNTER — Other Ambulatory Visit (HOSPITAL_COMMUNITY): Payer: Self-pay

## 2022-10-07 ENCOUNTER — Other Ambulatory Visit (HOSPITAL_COMMUNITY): Payer: Self-pay

## 2022-10-07 MED ORDER — ROSUVASTATIN CALCIUM 5 MG PO TABS
5.0000 mg | ORAL_TABLET | Freq: Every day | ORAL | 3 refills | Status: DC
  Filled 2022-10-07: qty 90, 90d supply, fill #0
  Filled 2023-01-02: qty 90, 90d supply, fill #1
  Filled 2023-04-06: qty 90, 90d supply, fill #2
  Filled 2023-07-07: qty 90, 90d supply, fill #3

## 2022-10-08 IMAGING — CR DG FACIAL BONES 1-2V
2 series · 2 of 2 positions shown · non-contrast
Comparison: None.

CLINICAL DATA: Dog bite on the right chin.

EXAM:
FACIAL BONES - 1-2 VIEW

[w waters pa]
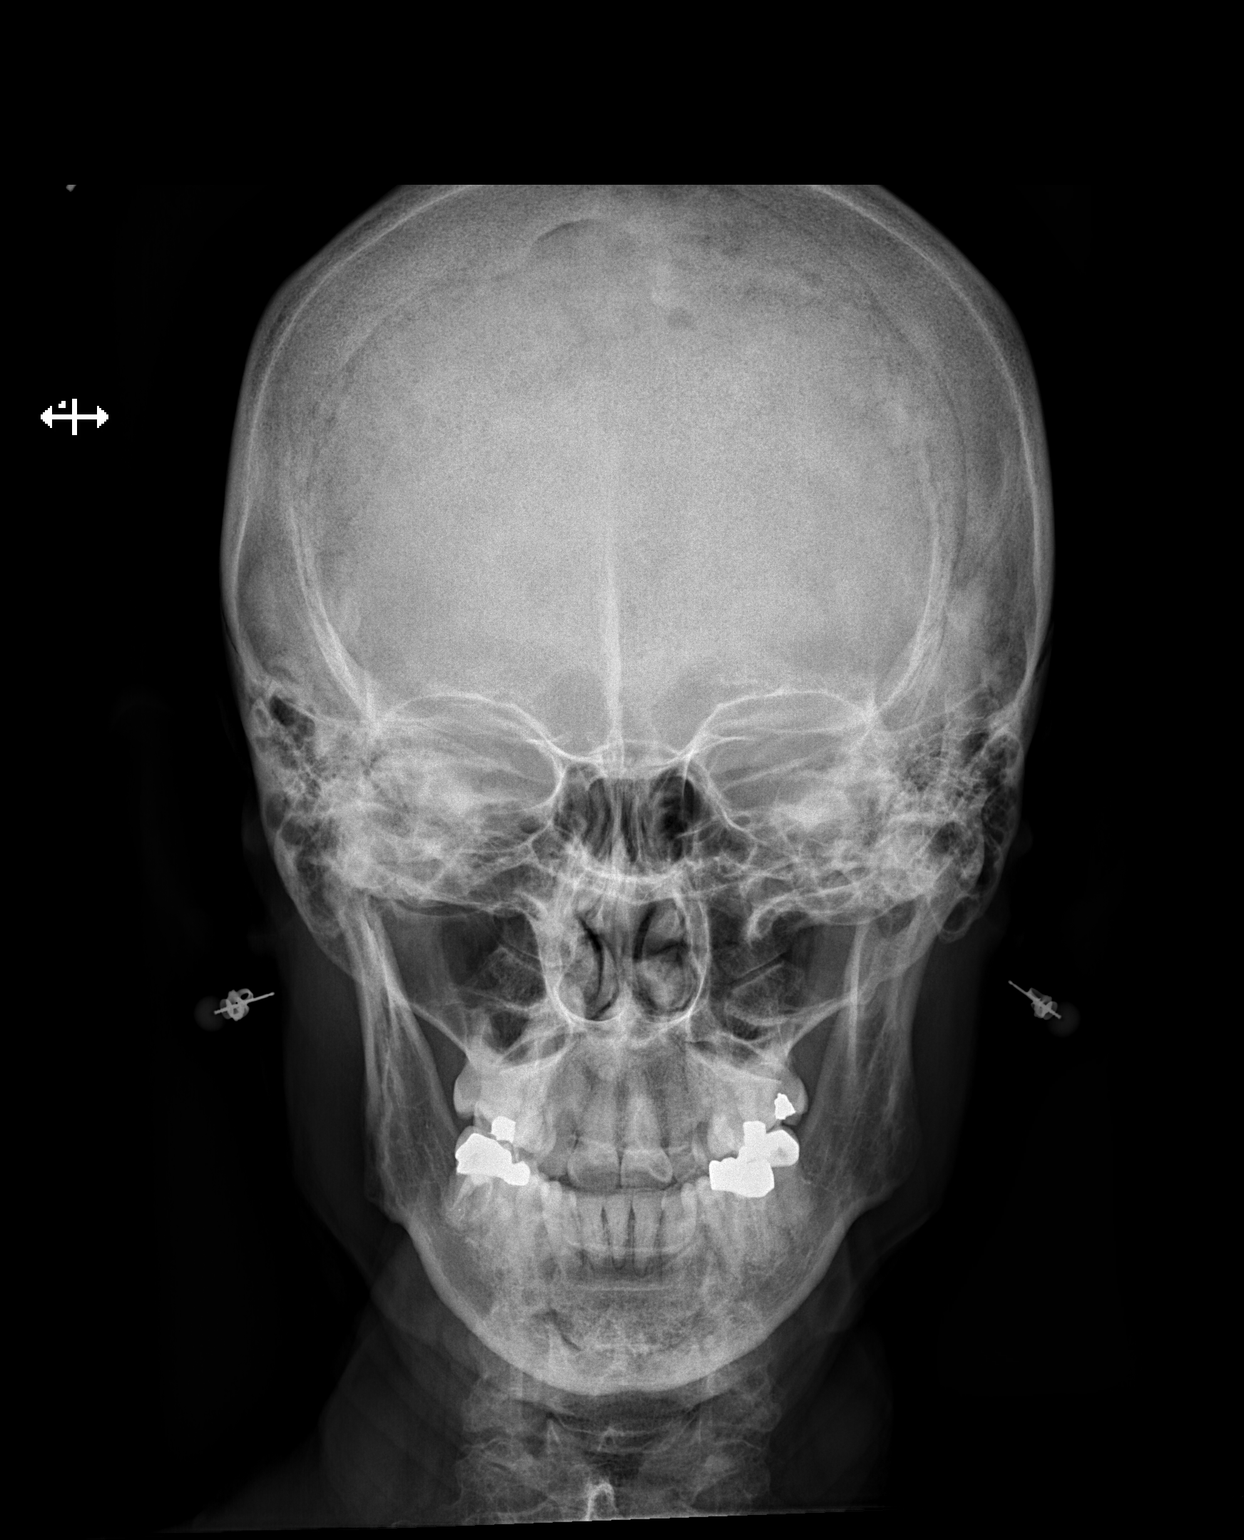

[[person_name] pa]
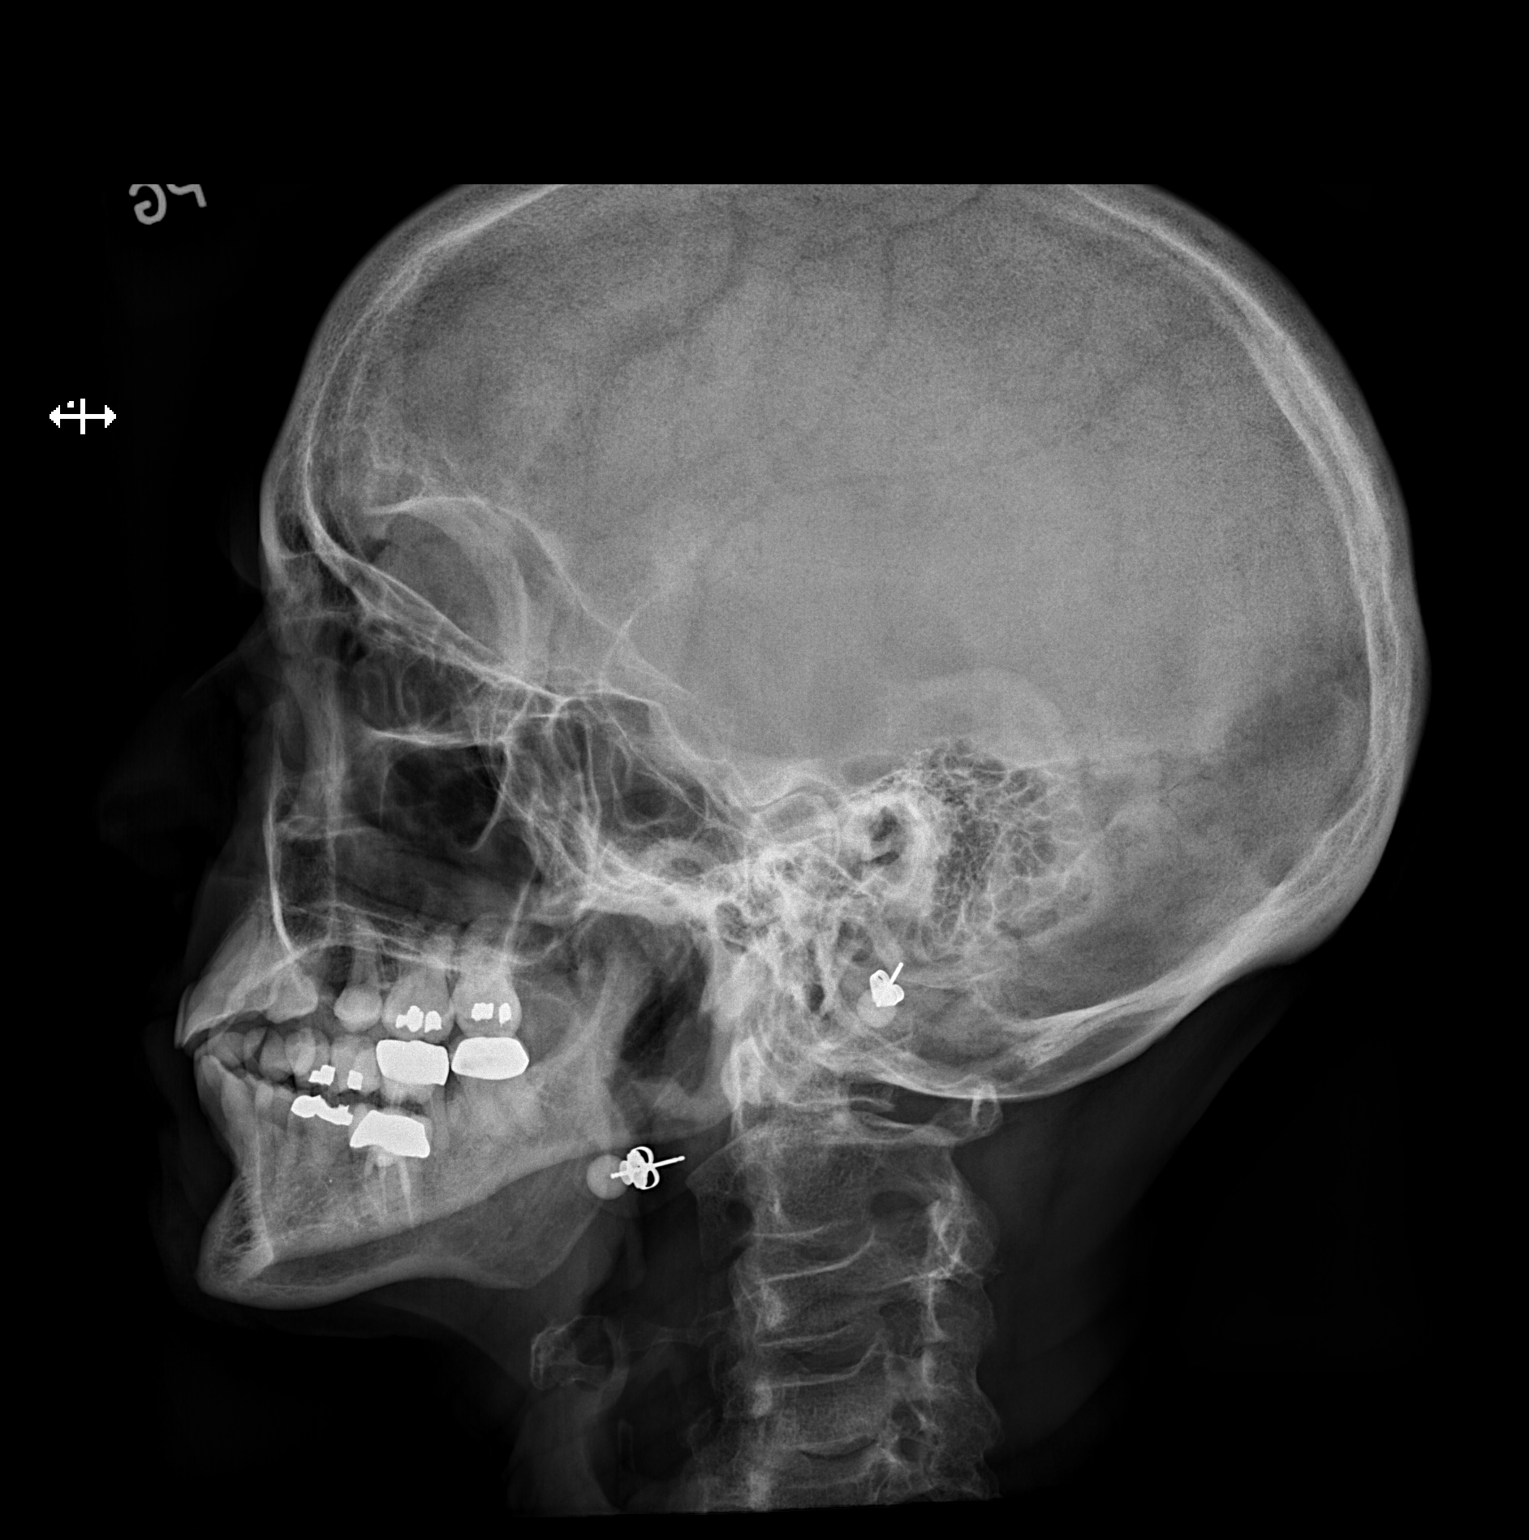

[2 of 2 positions shown; findings below may reference images not displayed]

FINDINGS: No acute fracture or dislocation. The visualized paranasal sinuses
and mastoid air cells are clear. Soft tissues are unremarkable.
IMPRESSION: Negative.

## 2022-10-13 ENCOUNTER — Other Ambulatory Visit (HOSPITAL_COMMUNITY): Payer: Self-pay

## 2022-10-20 ENCOUNTER — Other Ambulatory Visit (HOSPITAL_COMMUNITY): Payer: Self-pay

## 2022-10-20 MED ORDER — FLUOROURACIL 5 % EX CREA
TOPICAL_CREAM | CUTANEOUS | 11 refills | Status: AC
Start: 2022-10-20 — End: ?
  Filled 2022-10-20 – 2022-11-02 (×2): qty 40, 30d supply, fill #0

## 2022-10-28 ENCOUNTER — Other Ambulatory Visit (HOSPITAL_COMMUNITY): Payer: Self-pay

## 2022-11-02 ENCOUNTER — Other Ambulatory Visit (HOSPITAL_COMMUNITY): Payer: Self-pay

## 2022-11-06 ENCOUNTER — Other Ambulatory Visit (HOSPITAL_COMMUNITY): Payer: Self-pay

## 2022-11-06 MED ORDER — AMPHETAMINE-DEXTROAMPHET ER 20 MG PO CP24
20.0000 mg | ORAL_CAPSULE | Freq: Every day | ORAL | 0 refills | Status: DC
  Filled 2022-12-15: qty 30, 30d supply, fill #0

## 2022-11-06 MED ORDER — AMPHETAMINE-DEXTROAMPHET ER 20 MG PO CP24: 20.0000 mg | ORAL_CAPSULE | Freq: Every day | ORAL | 0 refills | Status: DC

## 2022-11-06 MED ORDER — HYDROXYZINE PAMOATE 25 MG PO CAPS
25.0000 mg | ORAL_CAPSULE | Freq: Two times a day (BID) | ORAL | 1 refills | Status: AC | PRN
Start: 2022-11-06 — End: ?
  Filled 2022-11-06 – 2023-01-04 (×2): qty 180, 90d supply, fill #0
  Filled 2023-04-09: qty 180, 90d supply, fill #1

## 2022-11-06 MED ORDER — AMPHETAMINE-DEXTROAMPHET ER 20 MG PO CP24
20.0000 mg | ORAL_CAPSULE | Freq: Every day | ORAL | 0 refills | Status: DC
  Filled 2022-11-13: qty 30, 30d supply, fill #0

## 2022-11-13 ENCOUNTER — Other Ambulatory Visit (HOSPITAL_COMMUNITY): Payer: Self-pay

## 2022-12-15 ENCOUNTER — Other Ambulatory Visit (HOSPITAL_COMMUNITY): Payer: Self-pay

## 2022-12-16 ENCOUNTER — Other Ambulatory Visit (HOSPITAL_COMMUNITY): Payer: Self-pay

## 2022-12-17 ENCOUNTER — Other Ambulatory Visit (HOSPITAL_COMMUNITY): Payer: Self-pay

## 2022-12-31 ENCOUNTER — Other Ambulatory Visit (HOSPITAL_COMMUNITY): Payer: Self-pay

## 2023-01-04 ENCOUNTER — Other Ambulatory Visit (HOSPITAL_COMMUNITY): Payer: Self-pay

## 2023-01-13 ENCOUNTER — Other Ambulatory Visit (HOSPITAL_COMMUNITY): Payer: Self-pay

## 2023-01-13 MED ORDER — MELOXICAM 15 MG PO TABS
15.0000 mg | ORAL_TABLET | Freq: Every day | ORAL | 1 refills | Status: AC
Start: 2023-01-13 — End: ?
  Filled 2023-01-13: qty 30, 30d supply, fill #0

## 2023-01-13 MED ORDER — PREDNISONE 5 MG (21) PO TBPK
ORAL_TABLET | ORAL | 0 refills | Status: AC
Start: 2023-01-13 — End: ?
  Filled 2023-01-13: qty 21, 6d supply, fill #0

## 2023-01-29 ENCOUNTER — Encounter (HOSPITAL_COMMUNITY): Payer: Self-pay

## 2023-01-29 ENCOUNTER — Other Ambulatory Visit: Payer: Self-pay

## 2023-01-29 ENCOUNTER — Emergency Department (HOSPITAL_COMMUNITY)
Admission: EM | Admit: 2023-01-29 | Discharge: 2023-01-29 | Disposition: A | Discharge status: Home / Self Care | Payer: PPO | Attending: Emergency Medicine | Admitting: Emergency Medicine

## 2023-01-29 DIAGNOSIS — W540XXA Bitten by dog, initial encounter: Secondary | ICD-10-CM | POA: Diagnosis not present

## 2023-01-29 DIAGNOSIS — S0181XA Laceration without foreign body of other part of head, initial encounter: Secondary | ICD-10-CM | POA: Insufficient documentation

## 2023-01-29 DIAGNOSIS — S0086XA Insect bite (nonvenomous) of other part of head, initial encounter: Secondary | ICD-10-CM | POA: Diagnosis present

## 2023-01-29 MED ORDER — LIDOCAINE HCL (PF) 1 % IJ SOLN
30.0000 mL | Freq: Once | INTRAMUSCULAR | Status: AC
  Administered 2023-01-29: 30 mL via INTRADERMAL
  Filled 2023-01-29: qty 30

## 2023-01-29 MED ORDER — AMOXICILLIN-POT CLAVULANATE 875-125 MG PO TABS
1.0000 | ORAL_TABLET | Freq: Once | ORAL | Status: AC
  Administered 2023-01-29: 1 via ORAL
  Filled 2023-01-29: qty 1

## 2023-01-29 MED ORDER — LORAZEPAM 1 MG PO TABS
1.0000 mg | ORAL_TABLET | Freq: Once | ORAL | Status: AC
  Administered 2023-01-29: 1 mg via ORAL
  Filled 2023-01-29: qty 1

## 2023-01-29 MED ORDER — IBUPROFEN 200 MG PO TABS
400.0000 mg | ORAL_TABLET | Freq: Once | ORAL | Status: AC
  Administered 2023-01-29: 400 mg via ORAL
  Filled 2023-01-29: qty 2

## 2023-01-29 MED ORDER — ACETAMINOPHEN 500 MG PO TABS
1000.0000 mg | ORAL_TABLET | Freq: Once | ORAL | Status: AC
  Administered 2023-01-29: 1000 mg via ORAL
  Filled 2023-01-29: qty 2

## 2023-01-29 MED ORDER — AMOXICILLIN-POT CLAVULANATE 875-125 MG PO TABS
1.0000 | ORAL_TABLET | Freq: Two times a day (BID) | ORAL | 0 refills | Status: DC
  Filled 2023-01-29: qty 14, 7d supply, fill #0

## 2023-01-29 NOTE — Discharge Instructions (Addendum)
I prescribed Augmentin for you to take for the next 7 days to prevent wound infection.  You should follow-up with your PCP to have the stitches out in approximately 5 to 6 days. I have also attached the follow-up information for the local otolaryngology group.  They were on-call for facial trauma tonight and would be able to follow-up for further wound care revisions, care and management if there are any complicating factors as part of the healing process.

## 2023-01-29 NOTE — ED Provider Notes (Signed)
[facility] Provider Note   CSN: [id number] Arrival date & time: 01/29/23  1932     History Chief Complaint  Patient presents with   Animal Bite    HPI Tiffany Curtis is a 67 y.o. female presenting for complex dog bite across the face.  Patient was petting a noon lab for whom she was caring when the dog attacked her.  States that her tetanus was updated 2 months ago for primary care.  Has already cleaned the wounds at home.  Animal is updated on vaccines and will be followed by local animal control. Patient's recorded medical, surgical, social, medication list and allergies were reviewed in the Snapshot window as part of the initial history.   Review of Systems   Review of Systems  Constitutional:  Negative for chills and fever.  HENT:  Negative for ear pain and sore throat.   Eyes:  Negative for pain and visual disturbance.  Respiratory:  Negative for cough and shortness of breath.   Cardiovascular:  Negative for chest pain and palpitations.  Gastrointestinal:  Negative for abdominal pain and vomiting.  Genitourinary:  Negative for dysuria and hematuria.  Musculoskeletal:  Negative for arthralgias and back pain.  Skin:  Negative for color change and rash.  Neurological:  Negative for seizures and syncope.  All other systems reviewed and are negative.   Physical Exam Updated Vital Signs BP 135/88   Pulse 70   Temp 97.8 F (36.6 C)   Resp 18   Ht 5\' 6"  (1.676 m)   Wt 53.1 kg   SpO2 98%   BMI 18.89 kg/m  Physical Exam Vitals and nursing note reviewed.  Constitutional:      General: She is not in acute distress.    Appearance: She is well-developed.  HENT:     Head: Normocephalic and atraumatic.  Eyes:     Conjunctiva/sclera: Conjunctivae normal.  Cardiovascular:     Rate and Rhythm: Normal rate and regular rhythm.     Heart sounds: No murmur heard. Pulmonary:     Effort: Pulmonary effort is normal. No respiratory  distress.     Breath sounds: Normal breath sounds.  Abdominal:     General: There is no distension.     Palpations: Abdomen is soft.     Tenderness: There is no abdominal tenderness. There is no right CVA tenderness or left CVA tenderness.  Musculoskeletal:        General: No swelling or tenderness. Normal range of motion.     Cervical back: Neck supple.     Comments: She has 3 stellate lacerations, the first is more linear though still jagged and is approximately 7 cm across her left chin and is a through and through laceration, the second is stellate and approximately 4 cm across her right cheek, the third is approximately 11 cm, stellate jagged involving the lip margin and is a through and through laceration..    Skin:    General: Skin is warm and dry.  Neurological:     General: No focal deficit present.     Mental Status: She is alert and oriented to person, place, and time. Mental status is at baseline.     Cranial Nerves: No cranial nerve deficit.      ED Course/ Medical Decision Making/ A&P    Procedures .Marland KitchenLaceration Repair  Date/Time: 01/29/2023 9:45 PM  Performed by: Glyn Ade, MD Authorized by: Glyn Ade, MD   Consent:  Consent obtained:  Verbal   Consent given by:  Patient   Risks, benefits, and alternatives were discussed: yes     Risks discussed:  Infection, need for additional repair, nerve damage, poor wound healing, poor cosmetic result, pain, retained foreign body, tendon damage and vascular damage   Alternatives discussed:  No treatment Anesthesia:    Anesthesia method:  Local infiltration   Local anesthetic:  Lidocaine 1% w/o epi Laceration details:    Location:  Face   Length (cm):  21 Exploration:    Hemostasis achieved with:  Direct pressure   Wound exploration: wound explored through full range of motion   Treatment:    Area cleansed with:  Saline   Amount of cleaning:  Extensive   Irrigation solution:  Sterile saline    Irrigation method:  Pressure wash   Debridement:  Moderate   Undermining:  Minimal   Layers/structures repaired:  Deep subcutaneous Deep subcutaneous:    Suture size:  5-0   Suture material:  Chromic gut Skin repair:    Repair method:  Sutures   Suture size:  5-0   Number of sutures:  17 Approximation:    Approximation:  Close Repair type:    Repair type:  Complex Post-procedure details:    Dressing:  Open (no dressing)   Procedure completion:  Tolerated    Medications Ordered in ED Medications  LORazepam (ATIVAN) tablet 1 mg (1 mg Oral Given 01/29/23 2023)  lidocaine (PF) (XYLOCAINE) 1 % injection 30 mL (30 mLs Intradermal Given by Other 01/29/23 2029)  amoxicillin-clavulanate (AUGMENTIN) 875-125 MG per tablet 1 tablet (1 tablet Oral Given 01/29/23 2150)  acetaminophen (TYLENOL) tablet 1,000 mg (1,000 mg Oral Given 01/29/23 2150)  ibuprofen (ADVIL) tablet 400 mg (400 mg Oral Given 01/29/23 2150)    Medical Decision Making:   Francely Chana is a 67 y.o. female who presented to the ED today with a 21 cm laceration to their face. Dog bite. They are neurovascularly intact.  Reviewed and confirmed nursing documentation for past medical history, family history, social history.     Assessment:   Laceration that will require repair.?  Given mechanism of injury, underlying fracture, deformity, wound site infection and systemic injury were all considered inconsistent with this current history of present illness and physical exam.  Plan:  Lac repair as noted in procedures Tetanus UTD Antibiotics for augmentin  Wound care with standard precautions  and antibiotic ointment   Radiology- Images reviewed personally and agree with radiology report  No results found.   Disposition:  I have considered need for hospitalization, however, considering all of the above, I believe this patient is stable for discharge at this time.  Patient/family educated about specific return precautions for given  chief complaint and symptoms.  Patient/family educated about follow-up with PCP.     Patient/family expressed understanding of return precautions and need for follow-up. Patient spoken to regarding all imaging and laboratory results and appropriate follow up for these results. All education provided in verbal form with additional information in written form. Time was allowed for answering of patient questions. Patient discharged.    Emergency Department Medication Summary:   Medications  LORazepam (ATIVAN) tablet 1 mg (1 mg Oral Given 01/29/23 2023)  lidocaine (PF) (XYLOCAINE) 1 % injection 30 mL (30 mLs Intradermal Given by Other 01/29/23 2029)  amoxicillin-clavulanate (AUGMENTIN) 875-125 MG per tablet 1 tablet (1 tablet Oral Given 01/29/23 2150)  acetaminophen (TYLENOL) tablet 1,000 mg (1,000 mg Oral Given 01/29/23 2150)  ibuprofen (ADVIL) tablet 400 mg (400 mg Oral Given 01/29/23 2150)               Clinical Impression:  1. Dog bite, initial encounter      Discharge   Final Clinical Impression(s) / ED Diagnoses Final diagnoses:  Dog bite, initial encounter    Rx / DC Orders ED Discharge Orders          Ordered    amoxicillin-clavulanate (AUGMENTIN) 875-125 MG tablet  Every 12 hours        01/29/23 2143              Glyn Ade, MD 01/29/23 2326

## 2023-01-29 NOTE — ED Triage Notes (Signed)
Pt was bit in the face by a lab. Pt has 3 lacerations to her face, lip, chin, and right cheek.

## 2023-01-30 ENCOUNTER — Other Ambulatory Visit (HOSPITAL_COMMUNITY): Payer: Self-pay

## 2023-02-01 ENCOUNTER — Telehealth: Payer: Self-pay | Admitting: Otolaryngology

## 2023-02-01 NOTE — Telephone Encounter (Signed)
New ENT office address pt was provided [address] [place] [phone] / ED f/u ED visit 01-29-23

## 2023-02-02 ENCOUNTER — Encounter (INDEPENDENT_AMBULATORY_CARE_PROVIDER_SITE_OTHER): Payer: Self-pay

## 2023-02-02 ENCOUNTER — Ambulatory Visit (INDEPENDENT_AMBULATORY_CARE_PROVIDER_SITE_OTHER): Payer: PPO

## 2023-02-02 VITALS — Ht 66.0 in | Wt 117.0 lb

## 2023-02-02 DIAGNOSIS — W540XXA Bitten by dog, initial encounter: Secondary | ICD-10-CM | POA: Diagnosis not present

## 2023-02-02 DIAGNOSIS — S0181XA Laceration without foreign body of other part of head, initial encounter: Secondary | ICD-10-CM | POA: Diagnosis not present

## 2023-02-02 NOTE — Progress Notes (Signed)
Dear Dr. Margaretann Loveless, Here is my assessment for our mutual patient, Tiffany Curtis. Thank you for allowing me the opportunity to care for your patient. Please do not hesitate to contact me should you have any other questions. Sincerely, Jovita Kussmaul, MD  Otolaryngology Clinic Note Referring provider: Dr. Margaretann Loveless HPI:  Tiffany Curtis is a 67 y.o. female kindly referred by Dr. Margaretann Loveless for evaluation of complex laceration of the face.    The patient notes that she was dog sitting on 01/29/2023 and [place] bit her in the face.  She went to the emergency room where she had the complex laceration repaired by the ED.  She was referred to our office for further evaluation.   She notes she was discharged home on Augmentin.  Since her ER visit she has been watching the wounds, no application of gels or creams.   She denies any pain, fever, drainage or any signs of infections.   She notes some weakness in her right lower lip with smiling. The swelling in her face has improved. She is not on anticoagulants or antiplatelets.   Patient additionally denies:  - malocclusion, teeth instability, trismus - enophthalmos, hypoglobus, vision loss or change,  - significant facial deformity - trouble chewing or swallowing - epistaxis, hearing loss after trauma, nasal obstruction - otorrhea, vertigo.   H&N surgery: denies  PMHx: Hypertension, Hypercholesteremia, Melanoma T1a upper arm   Independent Review of Additional Tests or Records:  ER visit from 12/29/2022 No facial imaging  PMH/Meds/All/SocHx/FamHx/ROS:   Past Medical History:  Diagnosis Date   Anxiety and depression    Atypical mole 04/29/2021   Right Lateral Plantar Surface (mild)   GERD (gastroesophageal reflux disease)    Hyperlipidemia    Hypertension    Lateral epicondylitis (tennis elbow)    right   SCC (squamous cell carcinoma) 09/16/1998   right temporal lesion Dr Danella Deis   SCC (squamous cell carcinoma) 07/22/2005   right temple tx with bx   SCC  (squamous cell carcinoma) 07/28/2012   right cheek tx with bx   Squamous cell carcinoma of skin 08/12/1998   right pre sideburn exc Dr Danella Deis   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 Danise Edge   COLONOSCOPY WITH PROPOFOL N/A 11/18/2015   Procedure: COLONOSCOPY WITH PROPOFOL;  Surgeon: Charolett Bumpers, MD;  Location: [facility];  Service: Endoscopy;  Laterality: N/A;   TYMPANOPLASTY     WRIST ARTHROSCOPY      Family History  Problem Relation Age of Onset   Liver cancer Mother    Heart disease Father 12     Social Connections: Not on file      Current Outpatient Medications:    amoxicillin-clavulanate (AUGMENTIN) 875-125 MG tablet, Take 1 tablet by mouth every 12 (twelve) hours., Disp: 14 tablet, Rfl: 0   desvenlafaxine (PRISTIQ) 50 MG 24 hr tablet, Take 1 tablet (50 mg total) by mouth daily. (fill 02/16/22), Disp: 90 tablet, Rfl: 3   diphenhydrAMINE (BENADRYL) 25 mg capsule, Take 50 mg by mouth at bedtime as needed for allergies. , Disp: , Rfl:    hydrOXYzine (VISTARIL) 25 MG capsule, Take 1 capsule by mouth twice a day as needed for anxiety, Disp: 180 capsule, Rfl: 1   losartan (COZAAR) 100 MG tablet, Take 1 tablet (100 mg total) by mouth daily., Disp: 90 tablet, Rfl: 3  Multiple Vitamin (MULTIVITAMIN WITH MINERALS) TABS tablet, Take 1 tablet by mouth daily., Disp: , Rfl:    rosuvastatin (CRESTOR) 5 MG tablet, Take 1 tablet (5 mg total) by mouth daily., Disp: 90 tablet, Rfl: 3   amphetamine-dextroamphetamine (ADDERALL XR) 20 MG 24 hr capsule, Take 1 capsule by mouth once a day, Disp: 30 capsule, Rfl: 0   amphetamine-dextroamphetamine (ADDERALL XR) 20 MG 24 hr capsule, Take 1 capsule by mouth once daily (Patient not taking: Reported on 02/02/2023), Disp: 30 capsule, Rfl: 0   amphetamine-dextroamphetamine (ADDERALL XR) 20 MG 24 hr  capsule, Take 1 capsule by mouth daily * Do Not fill until 01/16/22, Disp: 30 capsule, Rfl: 0   amphetamine-dextroamphetamine (ADDERALL XR) 20 MG 24 hr capsule, Take 1 capsule (20 mg total) by mouth daily. (Patient not taking: Reported on 02/02/2023), Disp: 30 capsule, Rfl: 0   amphetamine-dextroamphetamine (ADDERALL XR) 20 MG 24 hr capsule, Take 1 capsule by mouth daily (Patient not taking: Reported on 02/02/2023), Disp: 30 capsule, Rfl: 0   amphetamine-dextroamphetamine (ADDERALL XR) 20 MG 24 hr capsule, Take 1 capsule (20 mg total) by mouth daily. (fill 03/06/22), Disp: 30 capsule, Rfl: 0   amphetamine-dextroamphetamine (ADDERALL XR) 20 MG 24 hr capsule, Take 1 capsule (20 mg total) by mouth daily. (Patient not taking: Reported on 02/02/2023), Disp: 30 capsule, Rfl: 0   amphetamine-dextroamphetamine (ADDERALL XR) 20 MG 24 hr capsule, Take 1 capsule (20 mg total) by mouth daily. (Patient not taking: Reported on 02/02/2023), Disp: 30 capsule, Rfl: 0   amphetamine-dextroamphetamine (ADDERALL XR) 20 MG 24 hr capsule, Take 1 capsule (20 mg total) by mouth daily. (Patient not taking: Reported on 02/02/2023), Disp: 30 capsule, Rfl: 0   amphetamine-dextroamphetamine (ADDERALL XR) 20 MG 24 hr capsule, Take 1 capsule (20 mg total) by mouth daily (fill 07/06/22), Disp: 30 capsule, Rfl: 0   amphetamine-dextroamphetamine (ADDERALL XR) 20 MG 24 hr capsule, Take 1 capsule (20 mg total) by mouth daily (fill 06/06/22) (Patient not taking: Reported on 02/02/2023), Disp: 30 capsule, Rfl: 0   amphetamine-dextroamphetamine (ADDERALL XR) 20 MG 24 hr capsule, Take 1 capsule (20 mg) by mouth daily. (Patient not taking: Reported on 02/02/2023), Disp: 30 capsule, Rfl: 0   amphetamine-dextroamphetamine (ADDERALL XR) 20 MG 24 hr capsule, Take 1 capsule (20 mg total) by mouth daily. (Patient not taking: Reported on 02/02/2023), Disp: 30 capsule, Rfl: 0   amphetamine-dextroamphetamine (ADDERALL XR) 20 MG 24 hr capsule, Take 1 capsule  (20 mg total) by mouth daily. (Patient not taking: Reported on 02/02/2023), Disp: 30 capsule, Rfl: 0   amphetamine-dextroamphetamine (ADDERALL XR) 20 MG 24 hr capsule, Take 1 capsule (20 mg total) by mouth daily. (fill 12/06/22) (Patient not taking: Reported on 02/02/2023), Disp: 30 capsule, Rfl: 0   amphetamine-dextroamphetamine (ADDERALL XR) 20 MG 24 hr capsule, Take 1 capsule (20 mg total) by mouth daily (fill 01/05/23) (Patient not taking: Reported on 02/02/2023), Disp: 30 capsule, Rfl: 0   azithromycin (ZITHROMAX Z-PAK) 250 MG tablet, Follow package instructions, Disp: 6 tablet, Rfl: 0   Calcium Carb-Cholecalciferol (CALCIUM + D3) 600-800 MG-UNIT TABS, Take by mouth 2 (two) times daily., Disp: , Rfl:    celecoxib (CELEBREX) 100 MG capsule, Take 1 capsule by mouth twice a day, Disp: 60 capsule, Rfl: 2   cyclobenzaprine (FLEXERIL) 10 MG tablet, Take 1 tablet by mouth every 8 hours as needed for muscle pain., Disp: 10 tablet, Rfl: 0   desvenlafaxine (PRISTIQ) 50 MG 24 hr tablet, Take 1 tablet by mouth once a  day, Disp: 90 tablet, Rfl: 0   desvenlafaxine (PRISTIQ) 50 MG 24 hr tablet, Take 1 tablet by mouth once a day, Disp: 90 tablet, Rfl: 0   desvenlafaxine (PRISTIQ) 50 MG 24 hr tablet, Take 1 tablet (50 mg total) by mouth daily. (fill 02/16/22), Disp: 90 tablet, Rfl: 3   doxycycline (MONODOX) 100 MG capsule, Take 1 capsule (100 mg total) by mouth 2 (two) times daily for 5 days, Disp: 10 capsule, Rfl: 0   EPINEPHrine 0.3 mg/0.3 mL IJ SOAJ injection, INJECT AS DIRECTED AS NEEDED FOR SYSTEMIC ALLERGIC REACTION, Disp: 1 each, Rfl: 1   fluorouracil (EFUDEX) 5 % cream, Apply to affected area at bedtime for 14 days.  Expect irritation and avoid sun exposure., Disp: 40 g, Rfl: 11   HYDROcodone bit-homatropine (HYCODAN) 5-1.5 MG/5ML syrup, Take 5 mLs by mouth every 12 (twelve) hours as needed for 7 days, Disp: 70 mL, Rfl: 0   hydrOXYzine (VISTARIL) 25 MG capsule, Take 1 capsule by mouth twice a day as needed  for anxiety, Disp: 180 capsule, Rfl: 1   hydrOXYzine (VISTARIL) 25 MG capsule, Take 1 tablet twice daily as needed for anxiety/sleep (Patient not taking: Reported on 02/02/2023), Disp: 180 capsule, Rfl: 1   hydrOXYzine (VISTARIL) 25 MG capsule, Take 1 capsule (25 mg total) by mouth 2 (two) times daily as needed for anxiety and sleep, Disp: 180 capsule, Rfl: 1   hydrOXYzine (VISTARIL) 25 MG capsule, Take 1 tablet by mouth twice daily as needed for anxiety/sleep. (Patient not taking: Reported on 02/02/2023), Disp: 180 capsule, Rfl: 1   hydrOXYzine (VISTARIL) 25 MG capsule, Take 1 capsule (25 mg total) by mouth 2 (two) times daily as needed for anxiety/sleep. (Patient not taking: Reported on 02/02/2023), Disp: 180 capsule, Rfl: 1   hydrOXYzine (VISTARIL) 25 MG capsule, Take 1 capsule (25 mg total) by mouth 2 (two) times daily as needed for anxiety/sleep (Patient not taking: Reported on 02/02/2023), Disp: 180 capsule, Rfl: 1   lisdexamfetamine (VYVANSE) 40 MG capsule, TAKE 1 CAPSULE BY MOUTH DAILY*07/25/21, Disp: 30 capsule, Rfl: 0   losartan (COZAAR) 50 MG tablet, Take 1 tablet (50 mg total) by mouth daily. (Patient not taking: Reported on 02/02/2023), Disp: 90 tablet, Rfl: 3   meloxicam (MOBIC) 15 MG tablet, Take 1 tablet (15 mg total) by mouth daily with food. Start after completing prednisone dose pack (Patient not taking: Reported on 02/02/2023), Disp: 30 tablet, Rfl: 1   ondansetron (ZOFRAN) 4 MG tablet, Take 1 tablet by mouth every six hours as needed for nausea / vomiting, Disp: 10 tablet, Rfl: 0   oxyCODONE (OXY IR/ROXICODONE) 5 MG immediate release tablet, Take 1-2 tablets every 6 hours as needed for pain no more than 6 pills a day, Disp: 30 tablet, Rfl: 0   predniSONE (DELTASONE) 20 MG tablet, Take 1 tablet by mouth twice a day for 5 days, Disp: 10 tablet, Rfl: 0   predniSONE (STERAPRED UNI-PAK 21 TAB) 5 MG (21) TBPK tablet, Take as directed on package, Disp: 21 tablet, Rfl: 0   triamcinolone  cream (KENALOG) 0.1 %, Apply to the affected areas 1 to 2 times a day, Disp: 90 g, Rfl: 3   Physical Exam:   Ht 5\' 6"  (1.676 m)   Wt 117 lb (53.1 kg)   BMI 18.88 kg/m   Pertinent Findings  CN II-XII grossly intact except some weakness of the right lower lip Face stable; no palatal instability, no septal hematoma, no TTP of the nasal bridge Multiple lacerations  to the right face and nose with prolene sutures in place - through and through lip laceration upper and lower, some scabbing noted. Minimal erythema noted to the right cheek, no drainage. No obvious tenderness No lesions of oral cavity/oropharynx; dentition WNL No mandibular stepoffs No obviously palpable neck masses/lymphadenopathy/thyromegaly No respiratory distress or stridor    Seprately Identifiable Procedures:  None  Impression & Plans:  Tiffany Curtis is a 67 y.o. female with multiple facial lacerations repaired by the ED after a dog bite on 01/29/2023. Healing appropriately  Multiple facial lacerations (totaling 20 cm) - Through and through lower lip and upper lip laceration, right cheek laceration, infraorbital laceration - Healing appropriately, no evidence of infection - Continue/finish augmentin course - Discussed wound care including vaseline BID, sun avoidance - Will have him seen by our PA Trey Paula Hedges) this Friday for suture removal - Unclear what to make of the left marginal mandibular nerve/ lower lip weakness as I did not repair the laceration. Possible some neurapraxia from injury. Will monitor  - f/u 02/05/23 with Burna Forts, PA-C  Thank you for allowing me the opportunity to care for your patient. Please do not hesitate to contact me should you have any other questions.  Sincerely, Jovita Kussmaul, MD [facility] Phone: [phone] Fax: [phone]  02/02/2023, 12:19 PM   MDM:  Level 3 Complexity/Problems addressed: low Data complexity: low - Morbidity: low

## 2023-02-05 ENCOUNTER — Ambulatory Visit (INDEPENDENT_AMBULATORY_CARE_PROVIDER_SITE_OTHER): Payer: PPO | Admitting: Physician Assistant

## 2023-02-05 DIAGNOSIS — S0181XD Laceration without foreign body of other part of head, subsequent encounter: Secondary | ICD-10-CM | POA: Diagnosis not present

## 2023-02-05 DIAGNOSIS — W540XXD Bitten by dog, subsequent encounter: Secondary | ICD-10-CM | POA: Diagnosis not present

## 2023-02-05 NOTE — Progress Notes (Signed)
Dear Dr. Margaretann Loveless, Here is my assessment for our mutual patient, Tiffany Curtis. Thank you for allowing me the opportunity to care for your patient. Please do not hesitate to contact me should you have any other questions. Sincerely, Burna Forts PA-C  Otolaryngology Clinic Note Referring provider: Dr. Margaretann Loveless HPI:  Tiffany Curtis is a 67 y.o. female kindly referred by Dr. Margaretann Loveless for evaluation for suture removal The patient was last seen in the office on 02/02/23. She was bit on the face by a dog on 01/29/2023, this was repaired in the ER. She was referred to our office for evaluation. At her last visit she has some scabbing, no infection. She was instructed to use Vaseline on the incisions.   Since her last visit she notes the Vasaline has improved the appearance of her scabbing. She denies any infection, her weakens in the right lower lip has improved.     Independent Review of Additional Tests or Records:  none   PMH/Meds/All/SocHx/FamHx/ROS:   Past Medical History:  Diagnosis Date   Anxiety and depression    Atypical mole 04/29/2021   Right Lateral Plantar Surface (mild)   GERD (gastroesophageal reflux disease)    Hyperlipidemia    Hypertension    Lateral epicondylitis (tennis elbow)    right   SCC (squamous cell carcinoma) 09/16/1998   right temporal lesion Dr Danella Deis   SCC (squamous cell carcinoma) 07/22/2005   right temple tx with bx   SCC (squamous cell carcinoma) 07/28/2012   right cheek tx with bx   Squamous cell carcinoma of skin 08/12/1998   right pre sideburn exc Dr Danella Deis   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 Danise Edge   COLONOSCOPY WITH PROPOFOL N/A 11/18/2015   Procedure: COLONOSCOPY WITH PROPOFOL;  Surgeon: Charolett Bumpers, MD;  Location: [facility];  Service: Endoscopy;  Laterality: N/A;   TYMPANOPLASTY     WRIST  ARTHROSCOPY      Family History  Problem Relation Age of Onset   Liver cancer Mother    Heart disease Father 44     Social Connections: Not on file      Current Outpatient Medications:    amoxicillin-clavulanate (AUGMENTIN) 875-125 MG tablet, Take 1 tablet by mouth every 12 (twelve) hours., Disp: 14 tablet, Rfl: 0   amphetamine-dextroamphetamine (ADDERALL XR) 20 MG 24 hr capsule, Take 1 capsule by mouth once a day, Disp: 30 capsule, Rfl: 0   amphetamine-dextroamphetamine (ADDERALL XR) 20 MG 24 hr capsule, Take 1 capsule by mouth once daily (Patient not taking: Reported on 02/02/2023), Disp: 30 capsule, Rfl: 0   amphetamine-dextroamphetamine (ADDERALL XR) 20 MG 24 hr capsule, Take 1 capsule by mouth daily * Do Not fill until 01/16/22, Disp: 30 capsule, Rfl: 0   amphetamine-dextroamphetamine (ADDERALL XR) 20 MG 24 hr capsule, Take 1 capsule (20 mg total) by mouth daily. (Patient not taking: Reported on 02/02/2023), Disp: 30 capsule, Rfl: 0   amphetamine-dextroamphetamine (ADDERALL XR) 20 MG 24 hr capsule, Take 1 capsule by mouth daily (Patient not taking: Reported on 02/02/2023), Disp: 30 capsule, Rfl: 0   amphetamine-dextroamphetamine (ADDERALL XR) 20 MG 24 hr capsule, Take 1 capsule (20 mg total) by mouth daily. (fill 03/06/22), Disp: 30 capsule, Rfl: 0   amphetamine-dextroamphetamine (ADDERALL XR) 20 MG 24 hr capsule, Take 1 capsule (20 mg total) by mouth daily. (Patient not  taking: Reported on 02/02/2023), Disp: 30 capsule, Rfl: 0   amphetamine-dextroamphetamine (ADDERALL XR) 20 MG 24 hr capsule, Take 1 capsule (20 mg total) by mouth daily. (Patient not taking: Reported on 02/02/2023), Disp: 30 capsule, Rfl: 0   amphetamine-dextroamphetamine (ADDERALL XR) 20 MG 24 hr capsule, Take 1 capsule (20 mg total) by mouth daily. (Patient not taking: Reported on 02/02/2023), Disp: 30 capsule, Rfl: 0   amphetamine-dextroamphetamine (ADDERALL XR) 20 MG 24 hr capsule, Take 1 capsule (20 mg total) by  mouth daily (fill 07/06/22), Disp: 30 capsule, Rfl: 0   amphetamine-dextroamphetamine (ADDERALL XR) 20 MG 24 hr capsule, Take 1 capsule (20 mg total) by mouth daily (fill 06/06/22) (Patient not taking: Reported on 02/02/2023), Disp: 30 capsule, Rfl: 0   amphetamine-dextroamphetamine (ADDERALL XR) 20 MG 24 hr capsule, Take 1 capsule (20 mg) by mouth daily. (Patient not taking: Reported on 02/02/2023), Disp: 30 capsule, Rfl: 0   amphetamine-dextroamphetamine (ADDERALL XR) 20 MG 24 hr capsule, Take 1 capsule (20 mg total) by mouth daily. (Patient not taking: Reported on 02/02/2023), Disp: 30 capsule, Rfl: 0   amphetamine-dextroamphetamine (ADDERALL XR) 20 MG 24 hr capsule, Take 1 capsule (20 mg total) by mouth daily. (Patient not taking: Reported on 02/02/2023), Disp: 30 capsule, Rfl: 0   amphetamine-dextroamphetamine (ADDERALL XR) 20 MG 24 hr capsule, Take 1 capsule (20 mg total) by mouth daily. (fill 12/06/22) (Patient not taking: Reported on 02/02/2023), Disp: 30 capsule, Rfl: 0   amphetamine-dextroamphetamine (ADDERALL XR) 20 MG 24 hr capsule, Take 1 capsule (20 mg total) by mouth daily (fill 01/05/23) (Patient not taking: Reported on 02/02/2023), Disp: 30 capsule, Rfl: 0   azithromycin (ZITHROMAX Z-PAK) 250 MG tablet, Follow package instructions, Disp: 6 tablet, Rfl: 0   Calcium Carb-Cholecalciferol (CALCIUM + D3) 600-800 MG-UNIT TABS, Take by mouth 2 (two) times daily., Disp: , Rfl:    celecoxib (CELEBREX) 100 MG capsule, Take 1 capsule by mouth twice a day, Disp: 60 capsule, Rfl: 2   cyclobenzaprine (FLEXERIL) 10 MG tablet, Take 1 tablet by mouth every 8 hours as needed for muscle pain., Disp: 10 tablet, Rfl: 0   desvenlafaxine (PRISTIQ) 50 MG 24 hr tablet, Take 1 tablet by mouth once a day, Disp: 90 tablet, Rfl: 0   desvenlafaxine (PRISTIQ) 50 MG 24 hr tablet, Take 1 tablet by mouth once a day, Disp: 90 tablet, Rfl: 0   desvenlafaxine (PRISTIQ) 50 MG 24 hr tablet, Take 1 tablet (50 mg total) by mouth  daily. (fill 02/16/22), Disp: 90 tablet, Rfl: 3   desvenlafaxine (PRISTIQ) 50 MG 24 hr tablet, Take 1 tablet (50 mg total) by mouth daily. (fill 02/16/22), Disp: 90 tablet, Rfl: 3   diphenhydrAMINE (BENADRYL) 25 mg capsule, Take 50 mg by mouth at bedtime as needed for allergies. , Disp: , Rfl:    doxycycline (MONODOX) 100 MG capsule, Take 1 capsule (100 mg total) by mouth 2 (two) times daily for 5 days, Disp: 10 capsule, Rfl: 0   EPINEPHrine 0.3 mg/0.3 mL IJ SOAJ injection, INJECT AS DIRECTED AS NEEDED FOR SYSTEMIC ALLERGIC REACTION, Disp: 1 each, Rfl: 1   fluorouracil (EFUDEX) 5 % cream, Apply to affected area at bedtime for 14 days.  Expect irritation and avoid sun exposure., Disp: 40 g, Rfl: 11   HYDROcodone bit-homatropine (HYCODAN) 5-1.5 MG/5ML syrup, Take 5 mLs by mouth every 12 (twelve) hours as needed for 7 days, Disp: 70 mL, Rfl: 0   hydrOXYzine (VISTARIL) 25 MG capsule, Take 1 capsule by mouth twice a  day as needed for anxiety, Disp: 180 capsule, Rfl: 1   hydrOXYzine (VISTARIL) 25 MG capsule, Take 1 capsule by mouth twice a day as needed for anxiety, Disp: 180 capsule, Rfl: 1   hydrOXYzine (VISTARIL) 25 MG capsule, Take 1 tablet twice daily as needed for anxiety/sleep (Patient not taking: Reported on 02/02/2023), Disp: 180 capsule, Rfl: 1   hydrOXYzine (VISTARIL) 25 MG capsule, Take 1 capsule (25 mg total) by mouth 2 (two) times daily as needed for anxiety and sleep, Disp: 180 capsule, Rfl: 1   hydrOXYzine (VISTARIL) 25 MG capsule, Take 1 tablet by mouth twice daily as needed for anxiety/sleep. (Patient not taking: Reported on 02/02/2023), Disp: 180 capsule, Rfl: 1   hydrOXYzine (VISTARIL) 25 MG capsule, Take 1 capsule (25 mg total) by mouth 2 (two) times daily as needed for anxiety/sleep. (Patient not taking: Reported on 02/02/2023), Disp: 180 capsule, Rfl: 1   hydrOXYzine (VISTARIL) 25 MG capsule, Take 1 capsule (25 mg total) by mouth 2 (two) times daily as needed for anxiety/sleep (Patient  not taking: Reported on 02/02/2023), Disp: 180 capsule, Rfl: 1   lisdexamfetamine (VYVANSE) 40 MG capsule, TAKE 1 CAPSULE BY MOUTH DAILY*07/25/21, Disp: 30 capsule, Rfl: 0   losartan (COZAAR) 100 MG tablet, Take 1 tablet (100 mg total) by mouth daily., Disp: 90 tablet, Rfl: 3   losartan (COZAAR) 50 MG tablet, Take 1 tablet (50 mg total) by mouth daily. (Patient not taking: Reported on 02/02/2023), Disp: 90 tablet, Rfl: 3   meloxicam (MOBIC) 15 MG tablet, Take 1 tablet (15 mg total) by mouth daily with food. Start after completing prednisone dose pack (Patient not taking: Reported on 02/02/2023), Disp: 30 tablet, Rfl: 1   Multiple Vitamin (MULTIVITAMIN WITH MINERALS) TABS tablet, Take 1 tablet by mouth daily., Disp: , Rfl:    ondansetron (ZOFRAN) 4 MG tablet, Take 1 tablet by mouth every six hours as needed for nausea / vomiting, Disp: 10 tablet, Rfl: 0   oxyCODONE (OXY IR/ROXICODONE) 5 MG immediate release tablet, Take 1-2 tablets every 6 hours as needed for pain no more than 6 pills a day, Disp: 30 tablet, Rfl: 0   predniSONE (DELTASONE) 20 MG tablet, Take 1 tablet by mouth twice a day for 5 days, Disp: 10 tablet, Rfl: 0   predniSONE (STERAPRED UNI-PAK 21 TAB) 5 MG (21) TBPK tablet, Take as directed on package, Disp: 21 tablet, Rfl: 0   rosuvastatin (CRESTOR) 5 MG tablet, Take 1 tablet (5 mg total) by mouth daily., Disp: 90 tablet, Rfl: 3   triamcinolone cream (KENALOG) 0.1 %, Apply to the affected areas 1 to 2 times a day, Disp: 90 g, Rfl: 3   Physical Exam:   There were no vitals taken for this visit.  Pertinent Findings  CN II-XII intact, minimal weakness to right lower lip No lesions of oral cavity/oropharynx; dentition WNL No obviously palpable neck masses/lymphadenopathy/thyromegaly No respiratory distress or stridor Minimal scabbing to right sided facial lacerations, no surrounding redness, discharge or warmth. Sutures in place.   Seprately Identifiable Procedures:  None  Impression  & Plans:  Tiffany Curtis is a 67 y.o. female here today for follow up and suture removal. Her wounds are healing as expected, she is happy with the progression over the last week. She has no signs of infection, her lower lip weakness has improved. I removed her sutures today without complication. I discussed wound care, scar management, and follow up precautions. I offered for her to follow up PRN, she is happy  with that plan. She had no further questions or concerns.    - f/u PRN   Thank you for allowing me the opportunity to care for your patient. Please do not hesitate to contact me should you have any other questions.  Sincerely, Burna Forts PA-C [facility] Phone: [phone] Fax: [phone]  02/05/2023, 3:24 PM

## 2023-02-05 NOTE — Patient Instructions (Addendum)
Skinuva scar  [place]

## 2023-02-11 ENCOUNTER — Other Ambulatory Visit (HOSPITAL_COMMUNITY): Payer: Self-pay

## 2023-02-11 MED ORDER — DESVENLAFAXINE SUCCINATE ER 50 MG PO TB24
50.0000 mg | ORAL_TABLET | Freq: Every day | ORAL | 0 refills | Status: DC
  Filled 2023-04-01: qty 90, 90d supply, fill #0

## 2023-02-11 MED ORDER — HYDROXYZINE PAMOATE 25 MG PO CAPS
25.0000 mg | ORAL_CAPSULE | Freq: Two times a day (BID) | ORAL | 0 refills | Status: AC | PRN
Start: 2023-02-11 — End: ?

## 2023-02-15 ENCOUNTER — Other Ambulatory Visit (HOSPITAL_COMMUNITY): Payer: Self-pay

## 2023-02-15 MED ORDER — AMPHETAMINE-DEXTROAMPHET ER 20 MG PO CP24
20.0000 mg | ORAL_CAPSULE | Freq: Every day | ORAL | 0 refills | Status: DC
  Filled 2023-02-15 – 2023-03-11 (×2): qty 30, 30d supply, fill #0

## 2023-02-15 MED ORDER — AMPHETAMINE-DEXTROAMPHET ER 20 MG PO CP24
20.0000 mg | ORAL_CAPSULE | Freq: Every day | ORAL | 0 refills | Status: DC
  Filled 2023-04-09: qty 30, 30d supply, fill #0

## 2023-02-17 ENCOUNTER — Other Ambulatory Visit (HOSPITAL_COMMUNITY): Payer: Self-pay

## 2023-02-17 ENCOUNTER — Ambulatory Visit (INDEPENDENT_AMBULATORY_CARE_PROVIDER_SITE_OTHER): Payer: PPO | Admitting: Physician Assistant

## 2023-02-17 ENCOUNTER — Encounter (INDEPENDENT_AMBULATORY_CARE_PROVIDER_SITE_OTHER): Payer: Self-pay

## 2023-02-17 DIAGNOSIS — R21 Rash and other nonspecific skin eruption: Secondary | ICD-10-CM | POA: Diagnosis not present

## 2023-02-17 MED ORDER — CLOTRIMAZOLE 1 % EX CREA
1.0000 | TOPICAL_CREAM | Freq: Two times a day (BID) | CUTANEOUS | 0 refills | Status: DC
  Filled 2023-02-17: qty 28, 14d supply, fill #0

## 2023-02-17 MED ORDER — DOXYCYCLINE HYCLATE 100 MG PO TABS
100.0000 mg | ORAL_TABLET | Freq: Two times a day (BID) | ORAL | 0 refills | Status: DC
  Filled 2023-02-17: qty 14, 7d supply, fill #0

## 2023-02-17 NOTE — Progress Notes (Signed)
Dear Dr. Margaretann Loveless, Here is my assessment for our mutual patient, Tiffany Curtis. Thank you for allowing me the opportunity to care for your patient. Please do not hesitate to contact me should you have any other questions. Sincerely, Burna Forts PA-C  Otolaryngology Clinic Note Referring provider: Dr. Margaretann Loveless HPI:  Tiffany Curtis is a 67 y.o. female kindly referred by Dr. Margaretann Loveless for evaluation of laceration.    Tiffany Curtis was last seen in our office on 02/05/2023 for follow-up evaluation status post ER repair of facial laceration.  The patient was bit by a dog on the face on 01/29/2023, this was repaired in the emergency room.  I saw her in our office on 02/05/2023.  She was healing well without significant issues or complications.  She was instructed to use Vaseline on the incisions.  She notes that she has been using them since 15 November but approximately 5 days ago she developed a rash around the right cheek incision as well as the nose, no rashes or issues around the lip incision.  She notes that she stopped using the Vaseline, the symptoms have continued to worsen slightly.  She denies any history of skin allergies previously.  She is not taking any new medications or using any other care products.  Denies any fever or swelling or any other infectious signs or symptoms.    Independent Review of Additional Tests or Records:  None   PMH/Meds/All/SocHx/FamHx/ROS:   Past Medical History:  Diagnosis Date   Anxiety and depression    Atypical mole 04/29/2021   Right Lateral Plantar Surface (mild)   GERD (gastroesophageal reflux disease)    Hyperlipidemia    Hypertension    Lateral epicondylitis (tennis elbow)    right   SCC (squamous cell carcinoma) 09/16/1998   right temporal lesion Dr Danella Deis   SCC (squamous cell carcinoma) 07/22/2005   right temple tx with bx   SCC (squamous cell carcinoma) 07/28/2012   right cheek tx with bx   Squamous cell carcinoma of skin 08/12/1998   right pre sideburn exc Dr  Danella Deis   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 Danise Edge   COLONOSCOPY WITH PROPOFOL N/A 11/18/2015   Procedure: COLONOSCOPY WITH PROPOFOL;  Surgeon: Charolett Bumpers, MD;  Location: [facility];  Service: Endoscopy;  Laterality: N/A;   TYMPANOPLASTY     WRIST ARTHROSCOPY      Family History  Problem Relation Age of Onset   Liver cancer Mother    Heart disease Father 69     Social Connections: Not on file      Current Outpatient Medications:    clotrimazole (LOTRIMIN) 1 % cream, Apply 1 Application topically 2 (two) times daily., Disp: 28 g, Rfl: 0   doxycycline (VIBRA-TABS) 100 MG tablet, Take 1 tablet (100 mg total) by mouth 2 (two) times daily., Disp: 14 tablet, Rfl: 0   amphetamine-dextroamphetamine (ADDERALL XR) 20 MG 24 hr capsule, Take 1 capsule by mouth once a day, Disp: 30 capsule, Rfl: 0   amphetamine-dextroamphetamine (ADDERALL XR) 20 MG 24 hr capsule, Take 1 capsule by mouth once daily (Patient not taking: Reported on 02/02/2023), Disp: 30 capsule, Rfl: 0   amphetamine-dextroamphetamine (ADDERALL XR) 20 MG 24 hr capsule, Take 1 capsule by mouth daily * Do Not fill until 01/16/22, Disp: 30 capsule, Rfl: 0   amphetamine-dextroamphetamine (ADDERALL  XR) 20 MG 24 hr capsule, Take 1 capsule (20 mg total) by mouth daily. (Patient not taking: Reported on 02/02/2023), Disp: 30 capsule, Rfl: 0   amphetamine-dextroamphetamine (ADDERALL XR) 20 MG 24 hr capsule, Take 1 capsule by mouth daily (Patient not taking: Reported on 02/02/2023), Disp: 30 capsule, Rfl: 0   amphetamine-dextroamphetamine (ADDERALL XR) 20 MG 24 hr capsule, Take 1 capsule (20 mg total) by mouth daily. (fill 03/06/22), Disp: 30 capsule, Rfl: 0   amphetamine-dextroamphetamine (ADDERALL XR) 20 MG 24 hr capsule, Take 1 capsule (20 mg total) by mouth daily. (Patient not  taking: Reported on 02/02/2023), Disp: 30 capsule, Rfl: 0   amphetamine-dextroamphetamine (ADDERALL XR) 20 MG 24 hr capsule, Take 1 capsule (20 mg total) by mouth daily. (Patient not taking: Reported on 02/02/2023), Disp: 30 capsule, Rfl: 0   amphetamine-dextroamphetamine (ADDERALL XR) 20 MG 24 hr capsule, Take 1 capsule (20 mg total) by mouth daily. (Patient not taking: Reported on 02/02/2023), Disp: 30 capsule, Rfl: 0   amphetamine-dextroamphetamine (ADDERALL XR) 20 MG 24 hr capsule, Take 1 capsule (20 mg total) by mouth daily (fill 07/06/22), Disp: 30 capsule, Rfl: 0   amphetamine-dextroamphetamine (ADDERALL XR) 20 MG 24 hr capsule, Take 1 capsule (20 mg total) by mouth daily (fill 06/06/22) (Patient not taking: Reported on 02/02/2023), Disp: 30 capsule, Rfl: 0   amphetamine-dextroamphetamine (ADDERALL XR) 20 MG 24 hr capsule, Take 1 capsule (20 mg) by mouth daily. (Patient not taking: Reported on 02/02/2023), Disp: 30 capsule, Rfl: 0   amphetamine-dextroamphetamine (ADDERALL XR) 20 MG 24 hr capsule, Take 1 capsule (20 mg total) by mouth daily. (Patient not taking: Reported on 02/02/2023), Disp: 30 capsule, Rfl: 0   amphetamine-dextroamphetamine (ADDERALL XR) 20 MG 24 hr capsule, Take 1 capsule (20 mg total) by mouth daily. (Patient not taking: Reported on 02/02/2023), Disp: 30 capsule, Rfl: 0   amphetamine-dextroamphetamine (ADDERALL XR) 20 MG 24 hr capsule, Take 1 capsule (20 mg total) by mouth daily. (fill 12/06/22) (Patient not taking: Reported on 02/02/2023), Disp: 30 capsule, Rfl: 0   amphetamine-dextroamphetamine (ADDERALL XR) 20 MG 24 hr capsule, Take 1 capsule (20 mg total) by mouth daily (fill 01/05/23) (Patient not taking: Reported on 02/02/2023), Disp: 30 capsule, Rfl: 0   [START ON 03/16/2023] amphetamine-dextroamphetamine (ADDERALL XR) 20 MG 24 hr capsule, Take 1 capsule (20 mg total) by mouth daily. *dnf 03/16/23, Disp: 30 capsule, Rfl: 0   amphetamine-dextroamphetamine (ADDERALL XR) 20 MG  24 hr capsule, Take 1 capsule (20 mg total) by mouth daily., Disp: 30 capsule, Rfl: 0   Calcium Carb-Cholecalciferol (CALCIUM + D3) 600-800 MG-UNIT TABS, Take by mouth 2 (two) times daily., Disp: , Rfl:    celecoxib (CELEBREX) 100 MG capsule, Take 1 capsule by mouth twice a day, Disp: 60 capsule, Rfl: 2   cyclobenzaprine (FLEXERIL) 10 MG tablet, Take 1 tablet by mouth every 8 hours as needed for muscle pain., Disp: 10 tablet, Rfl: 0   desvenlafaxine (PRISTIQ) 50 MG 24 hr tablet, Take 1 tablet by mouth once a day, Disp: 90 tablet, Rfl: 0   desvenlafaxine (PRISTIQ) 50 MG 24 hr tablet, Take 1 tablet by mouth once a day, Disp: 90 tablet, Rfl: 0   desvenlafaxine (PRISTIQ) 50 MG 24 hr tablet, Take 1 tablet (50 mg total) by mouth daily. (fill 02/16/22), Disp: 90 tablet, Rfl: 3   desvenlafaxine (PRISTIQ) 50 MG 24 hr tablet, Take 1 tablet (50 mg total) by mouth daily., Disp: 90 tablet, Rfl: 0   diphenhydrAMINE (BENADRYL) 25 mg capsule,  Take 50 mg by mouth at bedtime as needed for allergies. , Disp: , Rfl:    EPINEPHrine 0.3 mg/0.3 mL IJ SOAJ injection, INJECT AS DIRECTED AS NEEDED FOR SYSTEMIC ALLERGIC REACTION, Disp: 1 each, Rfl: 1   fluorouracil (EFUDEX) 5 % cream, Apply to affected area at bedtime for 14 days.  Expect irritation and avoid sun exposure., Disp: 40 g, Rfl: 11   HYDROcodone bit-homatropine (HYCODAN) 5-1.5 MG/5ML syrup, Take 5 mLs by mouth every 12 (twelve) hours as needed for 7 days, Disp: 70 mL, Rfl: 0   hydrOXYzine (VISTARIL) 25 MG capsule, Take 1 capsule by mouth twice a day as needed for anxiety, Disp: 180 capsule, Rfl: 1   hydrOXYzine (VISTARIL) 25 MG capsule, Take 1 capsule by mouth twice a day as needed for anxiety, Disp: 180 capsule, Rfl: 1   hydrOXYzine (VISTARIL) 25 MG capsule, Take 1 tablet twice daily as needed for anxiety/sleep (Patient not taking: Reported on 02/02/2023), Disp: 180 capsule, Rfl: 1   hydrOXYzine (VISTARIL) 25 MG capsule, Take 1 capsule (25 mg total) by mouth 2  (two) times daily as needed for anxiety and sleep, Disp: 180 capsule, Rfl: 1   hydrOXYzine (VISTARIL) 25 MG capsule, Take 1 tablet by mouth twice daily as needed for anxiety/sleep. (Patient not taking: Reported on 02/02/2023), Disp: 180 capsule, Rfl: 1   hydrOXYzine (VISTARIL) 25 MG capsule, Take 1 capsule (25 mg total) by mouth 2 (two) times daily as needed for anxiety/sleep. (Patient not taking: Reported on 02/02/2023), Disp: 180 capsule, Rfl: 1   hydrOXYzine (VISTARIL) 25 MG capsule, Take 1 capsule (25 mg total) by mouth 2 (two) times daily as needed for anxiety/sleep (Patient not taking: Reported on 02/02/2023), Disp: 180 capsule, Rfl: 1   hydrOXYzine (VISTARIL) 25 MG capsule, Take 1 capsule (25 mg total) by mouth 2 (two) times daily as needed for anxiety/sleep., Disp: 180 capsule, Rfl: 0   lisdexamfetamine (VYVANSE) 40 MG capsule, TAKE 1 CAPSULE BY MOUTH DAILY*07/25/21, Disp: 30 capsule, Rfl: 0   losartan (COZAAR) 100 MG tablet, Take 1 tablet (100 mg total) by mouth daily., Disp: 90 tablet, Rfl: 3   losartan (COZAAR) 50 MG tablet, Take 1 tablet (50 mg total) by mouth daily. (Patient not taking: Reported on 02/02/2023), Disp: 90 tablet, Rfl: 3   meloxicam (MOBIC) 15 MG tablet, Take 1 tablet (15 mg total) by mouth daily with food. Start after completing prednisone dose pack (Patient not taking: Reported on 02/02/2023), Disp: 30 tablet, Rfl: 1   Multiple Vitamin (MULTIVITAMIN WITH MINERALS) TABS tablet, Take 1 tablet by mouth daily., Disp: , Rfl:    ondansetron (ZOFRAN) 4 MG tablet, Take 1 tablet by mouth every six hours as needed for nausea / vomiting, Disp: 10 tablet, Rfl: 0   oxyCODONE (OXY IR/ROXICODONE) 5 MG immediate release tablet, Take 1-2 tablets every 6 hours as needed for pain no more than 6 pills a day, Disp: 30 tablet, Rfl: 0   predniSONE (DELTASONE) 20 MG tablet, Take 1 tablet by mouth twice a day for 5 days, Disp: 10 tablet, Rfl: 0   predniSONE (STERAPRED UNI-PAK 21 TAB) 5 MG (21) TBPK  tablet, Take as directed on package, Disp: 21 tablet, Rfl: 0   rosuvastatin (CRESTOR) 5 MG tablet, Take 1 tablet (5 mg total) by mouth daily., Disp: 90 tablet, Rfl: 3   triamcinolone cream (KENALOG) 0.1 %, Apply to the affected areas 1 to 2 times a day, Disp: 90 g, Rfl: 3   Physical Exam:   There  were no vitals taken for this visit.  Pertinent Findings  CN II-XII intact Scarring noted to the right cheek, nose and right upper lip with no significant scabbing over the incision sites.  She does have some raised erythematous areas with stellate lesions, no drainage, these are localized to the right cheek and nose, the lip incision is clean dry and intact without any issues    Seprately Identifiable Procedures:  None  Impression & Plans:  Tiffany Curtis is a 67 y.o. female with wound check  Rash-  The patient has an obvious eruption around the cheek and right nose.  I have lower suspicion for acute allergic reaction given the only topical product she has been using his Vaseline and this has not caused a rash throughout the entire area as she is using it.  Also given its occlusive nature it does raise suspicion for a fungal etiology, this also seems the most likely given its appearance.  She has no history of diabetes.  I do have some suspicion for secondary bacterial infection as well.  I discussed the treatment options with the patient, she has not used Vaseline or any topical products since she stopped 5 days ago.  She will continue to avoid any new products other than those prescribed.  I will send in a prescription for doxycycline to cover for secondary bacterial infection, also I have sent in clotrimazole 1% to be used on the affected areas twice daily.  She will call her office on Monday to give me an update, if she develops any new or worsening signs or symptoms she will reach out to primary care or urgent care as I would not be able to see her in the office over the weekend.  The patient is happy  with today's plan, she had no further questions or concerns.   - f/u Monday via phone   Thank you for allowing me the opportunity to care for your patient. Please do not hesitate to contact me should you have any other questions.  Sincerely, Burna Forts PA-C [facility] Phone: [phone] Fax: [phone]  02/17/2023, 3:53 PM

## 2023-02-22 ENCOUNTER — Telehealth (INDEPENDENT_AMBULATORY_CARE_PROVIDER_SITE_OTHER): Payer: Self-pay | Admitting: Physician Assistant

## 2023-02-22 NOTE — Telephone Encounter (Signed)
I spoke with Tiffany Curtis today following up on our visit last week. She notes that the rash has significantly improved. She is still using the topical cream and oral antibiotics. She is seeing a dermatologist tomorrow for a separate issue. She will have them check the rash as well. She will let me know how she is doing later this week.

## 2023-03-11 ENCOUNTER — Other Ambulatory Visit (HOSPITAL_COMMUNITY): Payer: Self-pay

## 2023-03-12 ENCOUNTER — Other Ambulatory Visit (HOSPITAL_COMMUNITY): Payer: Self-pay

## 2023-03-22 ENCOUNTER — Other Ambulatory Visit (HOSPITAL_COMMUNITY): Payer: Self-pay

## 2023-03-22 MED ORDER — HYDROCORTISONE 2.5 % EX CREA
TOPICAL_CREAM | CUTANEOUS | 1 refills | Status: AC
Start: 2023-03-22 — End: ?
  Filled 2023-03-22: qty 30, 14d supply, fill #0

## 2023-03-22 MED ORDER — KETOCONAZOLE 1 % EX SHAM
MEDICATED_SHAMPOO | CUTANEOUS | 1 refills | Status: AC
Start: 2023-03-22 — End: ?

## 2023-04-01 ENCOUNTER — Other Ambulatory Visit (HOSPITAL_COMMUNITY): Payer: Self-pay

## 2023-04-01 ENCOUNTER — Other Ambulatory Visit (HOSPITAL_BASED_OUTPATIENT_CLINIC_OR_DEPARTMENT_OTHER): Payer: Self-pay

## 2023-04-01 ENCOUNTER — Other Ambulatory Visit: Payer: Self-pay

## 2023-04-02 ENCOUNTER — Other Ambulatory Visit (HOSPITAL_COMMUNITY): Payer: Self-pay

## 2023-04-09 ENCOUNTER — Other Ambulatory Visit (HOSPITAL_COMMUNITY): Payer: Self-pay

## 2023-04-09 MED ORDER — EPINEPHRINE 0.3 MG/0.3ML IJ SOAJ
INTRAMUSCULAR | 1 refills | Status: AC
Start: 2023-04-09 — End: ?
  Filled 2023-04-09: qty 2, 30d supply, fill #0

## 2023-04-10 ENCOUNTER — Other Ambulatory Visit (HOSPITAL_COMMUNITY): Payer: Self-pay

## 2023-04-21 ENCOUNTER — Other Ambulatory Visit (HOSPITAL_COMMUNITY): Payer: Self-pay

## 2023-04-21 MED ORDER — AMPHETAMINE-DEXTROAMPHET ER 20 MG PO CP24
20.0000 mg | ORAL_CAPSULE | Freq: Every day | ORAL | 0 refills | Status: DC
  Filled 2023-07-16: qty 30, 30d supply, fill #0

## 2023-04-21 MED ORDER — AMPHETAMINE-DEXTROAMPHET ER 20 MG PO CP24
20.0000 mg | ORAL_CAPSULE | Freq: Every day | ORAL | 0 refills | Status: DC
  Filled 2023-05-11: qty 30, 30d supply, fill #0

## 2023-04-21 MED ORDER — AMPHETAMINE-DEXTROAMPHET ER 20 MG PO CP24
20.0000 mg | ORAL_CAPSULE | Freq: Every day | ORAL | 0 refills | Status: DC
  Filled 2023-06-15: qty 30, 30d supply, fill #0

## 2023-04-21 MED ORDER — TRAZODONE HCL 50 MG PO TABS
50.0000 mg | ORAL_TABLET | Freq: Every evening | ORAL | 1 refills | Status: DC | PRN
  Filled 2023-04-21: qty 60, 30d supply, fill #0
  Filled 2023-05-25: qty 60, 30d supply, fill #1

## 2023-04-21 MED ORDER — DESVENLAFAXINE SUCCINATE ER 50 MG PO TB24
50.0000 mg | ORAL_TABLET | Freq: Every day | ORAL | 0 refills | Status: DC
  Filled 2023-04-21 – 2023-10-07 (×2): qty 90, 90d supply, fill #0

## 2023-04-22 ENCOUNTER — Other Ambulatory Visit (HOSPITAL_COMMUNITY): Payer: Self-pay

## 2023-05-11 ENCOUNTER — Other Ambulatory Visit (HOSPITAL_COMMUNITY): Payer: Self-pay

## 2023-05-28 DIAGNOSIS — J301 Allergic rhinitis due to pollen: Secondary | ICD-10-CM | POA: Diagnosis not present

## 2023-05-28 DIAGNOSIS — J3081 Allergic rhinitis due to animal (cat) (dog) hair and dander: Secondary | ICD-10-CM | POA: Diagnosis not present

## 2023-05-28 DIAGNOSIS — J3089 Other allergic rhinitis: Secondary | ICD-10-CM | POA: Diagnosis not present

## 2023-06-04 DIAGNOSIS — J301 Allergic rhinitis due to pollen: Secondary | ICD-10-CM | POA: Diagnosis not present

## 2023-06-04 DIAGNOSIS — J3081 Allergic rhinitis due to animal (cat) (dog) hair and dander: Secondary | ICD-10-CM | POA: Diagnosis not present

## 2023-06-04 DIAGNOSIS — J3089 Other allergic rhinitis: Secondary | ICD-10-CM | POA: Diagnosis not present

## 2023-06-11 DIAGNOSIS — J3081 Allergic rhinitis due to animal (cat) (dog) hair and dander: Secondary | ICD-10-CM | POA: Diagnosis not present

## 2023-06-11 DIAGNOSIS — J3089 Other allergic rhinitis: Secondary | ICD-10-CM | POA: Diagnosis not present

## 2023-06-11 DIAGNOSIS — J301 Allergic rhinitis due to pollen: Secondary | ICD-10-CM | POA: Diagnosis not present

## 2023-06-15 ENCOUNTER — Other Ambulatory Visit (HOSPITAL_COMMUNITY): Payer: Self-pay

## 2023-06-15 DIAGNOSIS — D649 Anemia, unspecified: Secondary | ICD-10-CM | POA: Diagnosis not present

## 2023-06-15 DIAGNOSIS — Z860101 Personal history of adenomatous and serrated colon polyps: Secondary | ICD-10-CM | POA: Diagnosis not present

## 2023-06-15 DIAGNOSIS — R194 Change in bowel habit: Secondary | ICD-10-CM | POA: Diagnosis not present

## 2023-06-16 ENCOUNTER — Other Ambulatory Visit: Payer: Self-pay | Admitting: Gastroenterology

## 2023-06-16 ENCOUNTER — Ambulatory Visit
Admission: RE | Admit: 2023-06-16 | Discharge: 2023-06-16 | Disposition: A | Discharge status: Home / Self Care | Source: Ambulatory Visit | Attending: Gastroenterology | Admitting: Gastroenterology

## 2023-06-16 DIAGNOSIS — R194 Change in bowel habit: Secondary | ICD-10-CM

## 2023-06-18 ENCOUNTER — Other Ambulatory Visit (HOSPITAL_COMMUNITY): Payer: Self-pay

## 2023-06-18 DIAGNOSIS — J3089 Other allergic rhinitis: Secondary | ICD-10-CM | POA: Diagnosis not present

## 2023-06-18 DIAGNOSIS — J3081 Allergic rhinitis due to animal (cat) (dog) hair and dander: Secondary | ICD-10-CM | POA: Diagnosis not present

## 2023-06-18 DIAGNOSIS — J301 Allergic rhinitis due to pollen: Secondary | ICD-10-CM | POA: Diagnosis not present

## 2023-06-18 MED ORDER — LOSARTAN POTASSIUM 100 MG PO TABS
100.0000 mg | ORAL_TABLET | Freq: Every day | ORAL | 2 refills | Status: DC
  Filled 2023-06-18: qty 90, 90d supply, fill #0
  Filled 2023-09-16: qty 90, 90d supply, fill #1
  Filled 2023-12-11: qty 90, 90d supply, fill #2

## 2023-06-24 ENCOUNTER — Other Ambulatory Visit (HOSPITAL_COMMUNITY): Payer: Self-pay

## 2023-06-24 DIAGNOSIS — F331 Major depressive disorder, recurrent, moderate: Secondary | ICD-10-CM | POA: Diagnosis not present

## 2023-06-24 DIAGNOSIS — F900 Attention-deficit hyperactivity disorder, predominantly inattentive type: Secondary | ICD-10-CM | POA: Diagnosis not present

## 2023-06-24 MED ORDER — TRAZODONE HCL 50 MG PO TABS
50.0000 mg | ORAL_TABLET | Freq: Every evening | ORAL | 1 refills | Status: DC | PRN
  Filled 2023-06-24: qty 180, 90d supply, fill #0
  Filled 2023-09-23: qty 180, 90d supply, fill #1

## 2023-06-24 MED ORDER — DESVENLAFAXINE SUCCINATE ER 50 MG PO TB24
50.0000 mg | ORAL_TABLET | Freq: Every day | ORAL | 0 refills | Status: DC
  Filled 2023-06-24: qty 90, 90d supply, fill #0

## 2023-06-24 MED ORDER — AMPHETAMINE-DEXTROAMPHET ER 20 MG PO CP24
20.0000 mg | ORAL_CAPSULE | Freq: Every day | ORAL | 0 refills | Status: DC
  Filled 2023-09-16: qty 30, 30d supply, fill #0

## 2023-06-24 MED ORDER — AMPHETAMINE-DEXTROAMPHET ER 20 MG PO CP24
20.0000 mg | ORAL_CAPSULE | Freq: Every day | ORAL | 0 refills | Status: DC
  Filled 2023-10-18: qty 30, 30d supply, fill #0

## 2023-06-24 MED ORDER — AMPHETAMINE-DEXTROAMPHET ER 20 MG PO CP24
20.0000 mg | ORAL_CAPSULE | Freq: Every day | ORAL | 0 refills | Status: DC
  Filled 2023-06-24 – 2023-08-18 (×2): qty 30, 30d supply, fill #0

## 2023-06-28 DIAGNOSIS — J3089 Other allergic rhinitis: Secondary | ICD-10-CM | POA: Diagnosis not present

## 2023-06-28 DIAGNOSIS — J3081 Allergic rhinitis due to animal (cat) (dog) hair and dander: Secondary | ICD-10-CM | POA: Diagnosis not present

## 2023-06-28 DIAGNOSIS — J301 Allergic rhinitis due to pollen: Secondary | ICD-10-CM | POA: Diagnosis not present

## 2023-07-08 DIAGNOSIS — J3081 Allergic rhinitis due to animal (cat) (dog) hair and dander: Secondary | ICD-10-CM | POA: Diagnosis not present

## 2023-07-08 DIAGNOSIS — J301 Allergic rhinitis due to pollen: Secondary | ICD-10-CM | POA: Diagnosis not present

## 2023-07-08 DIAGNOSIS — J3089 Other allergic rhinitis: Secondary | ICD-10-CM | POA: Diagnosis not present

## 2023-07-16 ENCOUNTER — Other Ambulatory Visit (HOSPITAL_COMMUNITY): Payer: Self-pay

## 2023-07-16 DIAGNOSIS — J3081 Allergic rhinitis due to animal (cat) (dog) hair and dander: Secondary | ICD-10-CM | POA: Diagnosis not present

## 2023-07-16 DIAGNOSIS — J3089 Other allergic rhinitis: Secondary | ICD-10-CM | POA: Diagnosis not present

## 2023-07-16 DIAGNOSIS — J301 Allergic rhinitis due to pollen: Secondary | ICD-10-CM | POA: Diagnosis not present

## 2023-07-16 MED ORDER — PEG 3350-KCL-NA BICARB-NACL 420 G PO SOLR
ORAL | 0 refills | Status: DC
  Filled 2023-07-16: qty 4000, 1d supply, fill #0

## 2023-07-16 MED ORDER — BISACODYL 5 MG PO TBEC
DELAYED_RELEASE_TABLET | ORAL | 0 refills | Status: DC
  Filled 2023-07-16: qty 4, 1d supply, fill #0

## 2023-07-20 DIAGNOSIS — L814 Other melanin hyperpigmentation: Secondary | ICD-10-CM | POA: Diagnosis not present

## 2023-07-20 DIAGNOSIS — D0472 Carcinoma in situ of skin of left lower limb, including hip: Secondary | ICD-10-CM | POA: Diagnosis not present

## 2023-07-20 DIAGNOSIS — D0462 Carcinoma in situ of skin of left upper limb, including shoulder: Secondary | ICD-10-CM | POA: Diagnosis not present

## 2023-07-20 DIAGNOSIS — D485 Neoplasm of uncertain behavior of skin: Secondary | ICD-10-CM | POA: Diagnosis not present

## 2023-07-20 DIAGNOSIS — Z85820 Personal history of malignant melanoma of skin: Secondary | ICD-10-CM | POA: Diagnosis not present

## 2023-07-20 DIAGNOSIS — Z86018 Personal history of other benign neoplasm: Secondary | ICD-10-CM | POA: Diagnosis not present

## 2023-07-20 DIAGNOSIS — D229 Melanocytic nevi, unspecified: Secondary | ICD-10-CM | POA: Diagnosis not present

## 2023-07-20 DIAGNOSIS — L821 Other seborrheic keratosis: Secondary | ICD-10-CM | POA: Diagnosis not present

## 2023-07-20 DIAGNOSIS — D1801 Hemangioma of skin and subcutaneous tissue: Secondary | ICD-10-CM | POA: Diagnosis not present

## 2023-07-20 DIAGNOSIS — L578 Other skin changes due to chronic exposure to nonionizing radiation: Secondary | ICD-10-CM | POA: Diagnosis not present

## 2023-07-22 DIAGNOSIS — K635 Polyp of colon: Secondary | ICD-10-CM | POA: Diagnosis not present

## 2023-07-22 DIAGNOSIS — K5730 Diverticulosis of large intestine without perforation or abscess without bleeding: Secondary | ICD-10-CM | POA: Diagnosis not present

## 2023-07-22 DIAGNOSIS — R194 Change in bowel habit: Secondary | ICD-10-CM | POA: Diagnosis not present

## 2023-07-22 DIAGNOSIS — D509 Iron deficiency anemia, unspecified: Secondary | ICD-10-CM | POA: Diagnosis not present

## 2023-07-22 DIAGNOSIS — K3189 Other diseases of stomach and duodenum: Secondary | ICD-10-CM | POA: Diagnosis not present

## 2023-07-22 DIAGNOSIS — K2930 Chronic superficial gastritis without bleeding: Secondary | ICD-10-CM | POA: Diagnosis not present

## 2023-07-26 DIAGNOSIS — K2930 Chronic superficial gastritis without bleeding: Secondary | ICD-10-CM | POA: Diagnosis not present

## 2023-07-26 DIAGNOSIS — J301 Allergic rhinitis due to pollen: Secondary | ICD-10-CM | POA: Diagnosis not present

## 2023-07-26 DIAGNOSIS — J3089 Other allergic rhinitis: Secondary | ICD-10-CM | POA: Diagnosis not present

## 2023-07-26 DIAGNOSIS — J3081 Allergic rhinitis due to animal (cat) (dog) hair and dander: Secondary | ICD-10-CM | POA: Diagnosis not present

## 2023-07-26 DIAGNOSIS — K635 Polyp of colon: Secondary | ICD-10-CM | POA: Diagnosis not present

## 2023-08-02 DIAGNOSIS — J3081 Allergic rhinitis due to animal (cat) (dog) hair and dander: Secondary | ICD-10-CM | POA: Diagnosis not present

## 2023-08-02 DIAGNOSIS — J3089 Other allergic rhinitis: Secondary | ICD-10-CM | POA: Diagnosis not present

## 2023-08-02 DIAGNOSIS — J301 Allergic rhinitis due to pollen: Secondary | ICD-10-CM | POA: Diagnosis not present

## 2023-08-03 DIAGNOSIS — H906 Mixed conductive and sensorineural hearing loss, bilateral: Secondary | ICD-10-CM | POA: Diagnosis not present

## 2023-08-04 DIAGNOSIS — C44629 Squamous cell carcinoma of skin of left upper limb, including shoulder: Secondary | ICD-10-CM | POA: Diagnosis not present

## 2023-08-10 DIAGNOSIS — J3089 Other allergic rhinitis: Secondary | ICD-10-CM | POA: Diagnosis not present

## 2023-08-10 DIAGNOSIS — J3081 Allergic rhinitis due to animal (cat) (dog) hair and dander: Secondary | ICD-10-CM | POA: Diagnosis not present

## 2023-08-10 DIAGNOSIS — J301 Allergic rhinitis due to pollen: Secondary | ICD-10-CM | POA: Diagnosis not present

## 2023-08-18 ENCOUNTER — Other Ambulatory Visit (HOSPITAL_COMMUNITY): Payer: Self-pay

## 2023-08-18 DIAGNOSIS — Z4802 Encounter for removal of sutures: Secondary | ICD-10-CM | POA: Diagnosis not present

## 2023-08-18 DIAGNOSIS — S0185XD Open bite of other part of head, subsequent encounter: Secondary | ICD-10-CM | POA: Diagnosis not present

## 2023-08-19 ENCOUNTER — Other Ambulatory Visit (HOSPITAL_COMMUNITY): Payer: Self-pay

## 2023-08-21 DIAGNOSIS — E7800 Pure hypercholesterolemia, unspecified: Secondary | ICD-10-CM | POA: Diagnosis not present

## 2023-08-21 DIAGNOSIS — F339 Major depressive disorder, recurrent, unspecified: Secondary | ICD-10-CM | POA: Diagnosis not present

## 2023-08-21 DIAGNOSIS — M818 Other osteoporosis without current pathological fracture: Secondary | ICD-10-CM | POA: Diagnosis not present

## 2023-08-21 DIAGNOSIS — I10 Essential (primary) hypertension: Secondary | ICD-10-CM | POA: Diagnosis not present

## 2023-08-23 DIAGNOSIS — J301 Allergic rhinitis due to pollen: Secondary | ICD-10-CM | POA: Diagnosis not present

## 2023-08-23 DIAGNOSIS — J3081 Allergic rhinitis due to animal (cat) (dog) hair and dander: Secondary | ICD-10-CM | POA: Diagnosis not present

## 2023-08-23 DIAGNOSIS — J3089 Other allergic rhinitis: Secondary | ICD-10-CM | POA: Diagnosis not present

## 2023-09-02 DIAGNOSIS — J3089 Other allergic rhinitis: Secondary | ICD-10-CM | POA: Diagnosis not present

## 2023-09-02 DIAGNOSIS — J301 Allergic rhinitis due to pollen: Secondary | ICD-10-CM | POA: Diagnosis not present

## 2023-09-02 DIAGNOSIS — J3081 Allergic rhinitis due to animal (cat) (dog) hair and dander: Secondary | ICD-10-CM | POA: Diagnosis not present

## 2023-09-10 DIAGNOSIS — J3089 Other allergic rhinitis: Secondary | ICD-10-CM | POA: Diagnosis not present

## 2023-09-10 DIAGNOSIS — J301 Allergic rhinitis due to pollen: Secondary | ICD-10-CM | POA: Diagnosis not present

## 2023-09-10 DIAGNOSIS — J3081 Allergic rhinitis due to animal (cat) (dog) hair and dander: Secondary | ICD-10-CM | POA: Diagnosis not present

## 2023-09-13 ENCOUNTER — Other Ambulatory Visit (HOSPITAL_COMMUNITY): Payer: Self-pay

## 2023-09-13 DIAGNOSIS — L237 Allergic contact dermatitis due to plants, except food: Secondary | ICD-10-CM | POA: Diagnosis not present

## 2023-09-13 MED ORDER — PREDNISONE 10 MG PO TABS
10.0000 mg | ORAL_TABLET | ORAL | 0 refills | Status: AC
Start: 2023-09-13 — End: ?
  Filled 2023-09-13: qty 42, 12d supply, fill #0

## 2023-09-16 ENCOUNTER — Other Ambulatory Visit (HOSPITAL_COMMUNITY): Payer: Self-pay

## 2023-09-17 DIAGNOSIS — J3089 Other allergic rhinitis: Secondary | ICD-10-CM | POA: Diagnosis not present

## 2023-09-17 DIAGNOSIS — J301 Allergic rhinitis due to pollen: Secondary | ICD-10-CM | POA: Diagnosis not present

## 2023-09-17 DIAGNOSIS — J3081 Allergic rhinitis due to animal (cat) (dog) hair and dander: Secondary | ICD-10-CM | POA: Diagnosis not present

## 2023-09-20 DIAGNOSIS — I10 Essential (primary) hypertension: Secondary | ICD-10-CM | POA: Diagnosis not present

## 2023-09-20 DIAGNOSIS — E7800 Pure hypercholesterolemia, unspecified: Secondary | ICD-10-CM | POA: Diagnosis not present

## 2023-09-20 DIAGNOSIS — M818 Other osteoporosis without current pathological fracture: Secondary | ICD-10-CM | POA: Diagnosis not present

## 2023-09-20 DIAGNOSIS — F339 Major depressive disorder, recurrent, unspecified: Secondary | ICD-10-CM | POA: Diagnosis not present

## 2023-09-22 DIAGNOSIS — J301 Allergic rhinitis due to pollen: Secondary | ICD-10-CM | POA: Diagnosis not present

## 2023-09-22 DIAGNOSIS — J3089 Other allergic rhinitis: Secondary | ICD-10-CM | POA: Diagnosis not present

## 2023-09-22 DIAGNOSIS — F331 Major depressive disorder, recurrent, moderate: Secondary | ICD-10-CM | POA: Diagnosis not present

## 2023-09-22 DIAGNOSIS — F900 Attention-deficit hyperactivity disorder, predominantly inattentive type: Secondary | ICD-10-CM | POA: Diagnosis not present

## 2023-09-22 DIAGNOSIS — J3081 Allergic rhinitis due to animal (cat) (dog) hair and dander: Secondary | ICD-10-CM | POA: Diagnosis not present

## 2023-09-25 ENCOUNTER — Other Ambulatory Visit (HOSPITAL_COMMUNITY): Payer: Self-pay

## 2023-09-30 DIAGNOSIS — J301 Allergic rhinitis due to pollen: Secondary | ICD-10-CM | POA: Diagnosis not present

## 2023-09-30 DIAGNOSIS — J3081 Allergic rhinitis due to animal (cat) (dog) hair and dander: Secondary | ICD-10-CM | POA: Diagnosis not present

## 2023-09-30 DIAGNOSIS — J3089 Other allergic rhinitis: Secondary | ICD-10-CM | POA: Diagnosis not present

## 2023-10-06 ENCOUNTER — Other Ambulatory Visit (HOSPITAL_COMMUNITY): Payer: Self-pay

## 2023-10-07 ENCOUNTER — Other Ambulatory Visit (HOSPITAL_COMMUNITY): Payer: Self-pay

## 2023-10-08 DIAGNOSIS — J301 Allergic rhinitis due to pollen: Secondary | ICD-10-CM | POA: Diagnosis not present

## 2023-10-08 DIAGNOSIS — J3081 Allergic rhinitis due to animal (cat) (dog) hair and dander: Secondary | ICD-10-CM | POA: Diagnosis not present

## 2023-10-08 DIAGNOSIS — J3089 Other allergic rhinitis: Secondary | ICD-10-CM | POA: Diagnosis not present

## 2023-10-15 DIAGNOSIS — J3081 Allergic rhinitis due to animal (cat) (dog) hair and dander: Secondary | ICD-10-CM | POA: Diagnosis not present

## 2023-10-15 DIAGNOSIS — J3089 Other allergic rhinitis: Secondary | ICD-10-CM | POA: Diagnosis not present

## 2023-10-15 DIAGNOSIS — J301 Allergic rhinitis due to pollen: Secondary | ICD-10-CM | POA: Diagnosis not present

## 2023-10-18 ENCOUNTER — Other Ambulatory Visit (HOSPITAL_COMMUNITY): Payer: Self-pay

## 2023-10-18 MED ORDER — ROSUVASTATIN CALCIUM 5 MG PO TABS
5.0000 mg | ORAL_TABLET | Freq: Every day | ORAL | 0 refills | Status: DC
  Filled 2023-10-18: qty 90, 90d supply, fill #0

## 2023-10-19 DIAGNOSIS — M25551 Pain in right hip: Secondary | ICD-10-CM | POA: Diagnosis not present

## 2023-10-21 DIAGNOSIS — E7800 Pure hypercholesterolemia, unspecified: Secondary | ICD-10-CM | POA: Diagnosis not present

## 2023-10-21 DIAGNOSIS — M818 Other osteoporosis without current pathological fracture: Secondary | ICD-10-CM | POA: Diagnosis not present

## 2023-10-21 DIAGNOSIS — J3081 Allergic rhinitis due to animal (cat) (dog) hair and dander: Secondary | ICD-10-CM | POA: Diagnosis not present

## 2023-10-21 DIAGNOSIS — J301 Allergic rhinitis due to pollen: Secondary | ICD-10-CM | POA: Diagnosis not present

## 2023-10-21 DIAGNOSIS — F339 Major depressive disorder, recurrent, unspecified: Secondary | ICD-10-CM | POA: Diagnosis not present

## 2023-10-21 DIAGNOSIS — J3089 Other allergic rhinitis: Secondary | ICD-10-CM | POA: Diagnosis not present

## 2023-10-21 DIAGNOSIS — I10 Essential (primary) hypertension: Secondary | ICD-10-CM | POA: Diagnosis not present

## 2023-10-29 DIAGNOSIS — J3089 Other allergic rhinitis: Secondary | ICD-10-CM | POA: Diagnosis not present

## 2023-10-29 DIAGNOSIS — J301 Allergic rhinitis due to pollen: Secondary | ICD-10-CM | POA: Diagnosis not present

## 2023-10-29 DIAGNOSIS — J3081 Allergic rhinitis due to animal (cat) (dog) hair and dander: Secondary | ICD-10-CM | POA: Diagnosis not present

## 2023-11-08 DIAGNOSIS — J301 Allergic rhinitis due to pollen: Secondary | ICD-10-CM | POA: Diagnosis not present

## 2023-11-08 DIAGNOSIS — J3081 Allergic rhinitis due to animal (cat) (dog) hair and dander: Secondary | ICD-10-CM | POA: Diagnosis not present

## 2023-11-08 DIAGNOSIS — J3089 Other allergic rhinitis: Secondary | ICD-10-CM | POA: Diagnosis not present

## 2023-11-09 DIAGNOSIS — D692 Other nonthrombocytopenic purpura: Secondary | ICD-10-CM | POA: Diagnosis not present

## 2023-11-09 DIAGNOSIS — L570 Actinic keratosis: Secondary | ICD-10-CM | POA: Diagnosis not present

## 2023-11-18 DIAGNOSIS — J3081 Allergic rhinitis due to animal (cat) (dog) hair and dander: Secondary | ICD-10-CM | POA: Diagnosis not present

## 2023-11-18 DIAGNOSIS — J3089 Other allergic rhinitis: Secondary | ICD-10-CM | POA: Diagnosis not present

## 2023-11-18 DIAGNOSIS — J301 Allergic rhinitis due to pollen: Secondary | ICD-10-CM | POA: Diagnosis not present

## 2023-11-20 ENCOUNTER — Other Ambulatory Visit (HOSPITAL_COMMUNITY): Payer: Self-pay

## 2023-11-21 DIAGNOSIS — I10 Essential (primary) hypertension: Secondary | ICD-10-CM | POA: Diagnosis not present

## 2023-11-21 DIAGNOSIS — F339 Major depressive disorder, recurrent, unspecified: Secondary | ICD-10-CM | POA: Diagnosis not present

## 2023-11-21 DIAGNOSIS — M818 Other osteoporosis without current pathological fracture: Secondary | ICD-10-CM | POA: Diagnosis not present

## 2023-11-21 DIAGNOSIS — E7800 Pure hypercholesterolemia, unspecified: Secondary | ICD-10-CM | POA: Diagnosis not present

## 2023-11-23 ENCOUNTER — Other Ambulatory Visit (HOSPITAL_COMMUNITY): Payer: Self-pay

## 2023-11-23 MED ORDER — AMPHETAMINE-DEXTROAMPHET ER 20 MG PO CP24
20.0000 mg | ORAL_CAPSULE | Freq: Every morning | ORAL | 0 refills | Status: DC
  Filled 2023-11-23: qty 30, 30d supply, fill #0

## 2023-11-26 ENCOUNTER — Other Ambulatory Visit (HOSPITAL_COMMUNITY): Payer: Self-pay

## 2023-11-27 ENCOUNTER — Other Ambulatory Visit (HOSPITAL_COMMUNITY): Payer: Self-pay

## 2023-12-02 DIAGNOSIS — J3081 Allergic rhinitis due to animal (cat) (dog) hair and dander: Secondary | ICD-10-CM | POA: Diagnosis not present

## 2023-12-02 DIAGNOSIS — J3089 Other allergic rhinitis: Secondary | ICD-10-CM | POA: Diagnosis not present

## 2023-12-02 DIAGNOSIS — J301 Allergic rhinitis due to pollen: Secondary | ICD-10-CM | POA: Diagnosis not present

## 2023-12-03 ENCOUNTER — Other Ambulatory Visit (HOSPITAL_COMMUNITY): Payer: Self-pay

## 2023-12-07 DIAGNOSIS — J3089 Other allergic rhinitis: Secondary | ICD-10-CM | POA: Diagnosis not present

## 2023-12-07 DIAGNOSIS — J301 Allergic rhinitis due to pollen: Secondary | ICD-10-CM | POA: Diagnosis not present

## 2023-12-07 DIAGNOSIS — J3081 Allergic rhinitis due to animal (cat) (dog) hair and dander: Secondary | ICD-10-CM | POA: Diagnosis not present

## 2023-12-08 DIAGNOSIS — J3089 Other allergic rhinitis: Secondary | ICD-10-CM | POA: Diagnosis not present

## 2023-12-08 DIAGNOSIS — J3081 Allergic rhinitis due to animal (cat) (dog) hair and dander: Secondary | ICD-10-CM | POA: Diagnosis not present

## 2023-12-08 DIAGNOSIS — J301 Allergic rhinitis due to pollen: Secondary | ICD-10-CM | POA: Diagnosis not present

## 2023-12-11 ENCOUNTER — Other Ambulatory Visit (HOSPITAL_COMMUNITY): Payer: Self-pay

## 2023-12-13 ENCOUNTER — Other Ambulatory Visit (HOSPITAL_COMMUNITY): Payer: Self-pay

## 2023-12-14 ENCOUNTER — Other Ambulatory Visit (HOSPITAL_COMMUNITY): Payer: Self-pay

## 2023-12-14 DIAGNOSIS — Z0000 Encounter for general adult medical examination without abnormal findings: Secondary | ICD-10-CM | POA: Diagnosis not present

## 2023-12-14 DIAGNOSIS — Z862 Personal history of diseases of the blood and blood-forming organs and certain disorders involving the immune mechanism: Secondary | ICD-10-CM | POA: Diagnosis not present

## 2023-12-14 DIAGNOSIS — M818 Other osteoporosis without current pathological fracture: Secondary | ICD-10-CM | POA: Diagnosis not present

## 2023-12-14 DIAGNOSIS — F909 Attention-deficit hyperactivity disorder, unspecified type: Secondary | ICD-10-CM | POA: Diagnosis not present

## 2023-12-14 DIAGNOSIS — Z23 Encounter for immunization: Secondary | ICD-10-CM | POA: Diagnosis not present

## 2023-12-14 DIAGNOSIS — M8589 Other specified disorders of bone density and structure, multiple sites: Secondary | ICD-10-CM | POA: Diagnosis not present

## 2023-12-14 DIAGNOSIS — D649 Anemia, unspecified: Secondary | ICD-10-CM | POA: Diagnosis not present

## 2023-12-14 DIAGNOSIS — F339 Major depressive disorder, recurrent, unspecified: Secondary | ICD-10-CM | POA: Diagnosis not present

## 2023-12-14 DIAGNOSIS — R194 Change in bowel habit: Secondary | ICD-10-CM | POA: Diagnosis not present

## 2023-12-14 DIAGNOSIS — I10 Essential (primary) hypertension: Secondary | ICD-10-CM | POA: Diagnosis not present

## 2023-12-14 DIAGNOSIS — E7800 Pure hypercholesterolemia, unspecified: Secondary | ICD-10-CM | POA: Diagnosis not present

## 2023-12-14 DIAGNOSIS — Z79899 Other long term (current) drug therapy: Secondary | ICD-10-CM | POA: Diagnosis not present

## 2023-12-14 DIAGNOSIS — H9190 Unspecified hearing loss, unspecified ear: Secondary | ICD-10-CM | POA: Diagnosis not present

## 2023-12-14 DIAGNOSIS — Z1389 Encounter for screening for other disorder: Secondary | ICD-10-CM | POA: Diagnosis not present

## 2023-12-14 MED ORDER — ROSUVASTATIN CALCIUM 5 MG PO TABS
5.0000 mg | ORAL_TABLET | Freq: Every day | ORAL | 3 refills | Status: AC
  Filled 2024-01-09: qty 90, 90d supply, fill #0
  Filled 2024-04-14: qty 90, 90d supply, fill #1

## 2023-12-16 DIAGNOSIS — J3089 Other allergic rhinitis: Secondary | ICD-10-CM | POA: Diagnosis not present

## 2023-12-16 DIAGNOSIS — J3081 Allergic rhinitis due to animal (cat) (dog) hair and dander: Secondary | ICD-10-CM | POA: Diagnosis not present

## 2023-12-16 DIAGNOSIS — J301 Allergic rhinitis due to pollen: Secondary | ICD-10-CM | POA: Diagnosis not present

## 2023-12-21 DIAGNOSIS — F339 Major depressive disorder, recurrent, unspecified: Secondary | ICD-10-CM | POA: Diagnosis not present

## 2023-12-21 DIAGNOSIS — E875 Hyperkalemia: Secondary | ICD-10-CM | POA: Diagnosis not present

## 2023-12-21 DIAGNOSIS — E7800 Pure hypercholesterolemia, unspecified: Secondary | ICD-10-CM | POA: Diagnosis not present

## 2023-12-21 DIAGNOSIS — I10 Essential (primary) hypertension: Secondary | ICD-10-CM | POA: Diagnosis not present

## 2023-12-21 DIAGNOSIS — M818 Other osteoporosis without current pathological fracture: Secondary | ICD-10-CM | POA: Diagnosis not present

## 2023-12-22 DIAGNOSIS — F331 Major depressive disorder, recurrent, moderate: Secondary | ICD-10-CM | POA: Diagnosis not present

## 2023-12-22 DIAGNOSIS — F900 Attention-deficit hyperactivity disorder, predominantly inattentive type: Secondary | ICD-10-CM | POA: Diagnosis not present

## 2023-12-24 ENCOUNTER — Other Ambulatory Visit (HOSPITAL_COMMUNITY): Payer: Self-pay

## 2023-12-24 ENCOUNTER — Other Ambulatory Visit: Payer: Self-pay

## 2023-12-24 MED ORDER — AMPHETAMINE-DEXTROAMPHET ER 20 MG PO CP24
20.0000 mg | ORAL_CAPSULE | Freq: Every day | ORAL | 0 refills | Status: DC
  Filled 2023-12-24: qty 30, 30d supply, fill #0

## 2023-12-24 MED ORDER — DESVENLAFAXINE SUCCINATE ER 50 MG PO TB24
50.0000 mg | ORAL_TABLET | Freq: Every day | ORAL | 0 refills | Status: AC
  Filled 2023-12-24: qty 90, 90d supply, fill #0

## 2023-12-24 MED ORDER — TRAZODONE HCL 50 MG PO TABS
50.0000 mg | ORAL_TABLET | Freq: Every evening | ORAL | 1 refills | Status: DC | PRN
  Filled 2023-12-24: qty 180, 90d supply, fill #0
  Filled 2024-03-09: qty 180, 90d supply, fill #1

## 2023-12-24 MED ORDER — AMPHETAMINE-DEXTROAMPHET ER 20 MG PO CP24
20.0000 mg | ORAL_CAPSULE | Freq: Every day | ORAL | 0 refills | Status: DC
  Filled 2024-03-01: qty 30, 30d supply, fill #0

## 2023-12-24 MED ORDER — AMPHETAMINE-DEXTROAMPHET ER 20 MG PO CP24
20.0000 mg | ORAL_CAPSULE | Freq: Every day | ORAL | 0 refills | Status: DC
  Filled 2024-01-27: qty 30, 30d supply, fill #0

## 2023-12-25 ENCOUNTER — Other Ambulatory Visit (HOSPITAL_COMMUNITY): Payer: Self-pay

## 2023-12-27 DIAGNOSIS — J3089 Other allergic rhinitis: Secondary | ICD-10-CM | POA: Diagnosis not present

## 2023-12-27 DIAGNOSIS — J301 Allergic rhinitis due to pollen: Secondary | ICD-10-CM | POA: Diagnosis not present

## 2023-12-27 DIAGNOSIS — J3081 Allergic rhinitis due to animal (cat) (dog) hair and dander: Secondary | ICD-10-CM | POA: Diagnosis not present

## 2024-01-04 DIAGNOSIS — J3089 Other allergic rhinitis: Secondary | ICD-10-CM | POA: Diagnosis not present

## 2024-01-04 DIAGNOSIS — J301 Allergic rhinitis due to pollen: Secondary | ICD-10-CM | POA: Diagnosis not present

## 2024-01-04 DIAGNOSIS — J3081 Allergic rhinitis due to animal (cat) (dog) hair and dander: Secondary | ICD-10-CM | POA: Diagnosis not present

## 2024-01-09 ENCOUNTER — Other Ambulatory Visit (HOSPITAL_COMMUNITY): Payer: Self-pay

## 2024-01-10 DIAGNOSIS — L2989 Other pruritus: Secondary | ICD-10-CM | POA: Diagnosis not present

## 2024-01-10 DIAGNOSIS — J3081 Allergic rhinitis due to animal (cat) (dog) hair and dander: Secondary | ICD-10-CM | POA: Diagnosis not present

## 2024-01-10 DIAGNOSIS — H1045 Other chronic allergic conjunctivitis: Secondary | ICD-10-CM | POA: Diagnosis not present

## 2024-01-10 DIAGNOSIS — J301 Allergic rhinitis due to pollen: Secondary | ICD-10-CM | POA: Diagnosis not present

## 2024-01-10 DIAGNOSIS — J3089 Other allergic rhinitis: Secondary | ICD-10-CM | POA: Diagnosis not present

## 2024-01-20 DIAGNOSIS — J3081 Allergic rhinitis due to animal (cat) (dog) hair and dander: Secondary | ICD-10-CM | POA: Diagnosis not present

## 2024-01-20 DIAGNOSIS — J3089 Other allergic rhinitis: Secondary | ICD-10-CM | POA: Diagnosis not present

## 2024-01-20 DIAGNOSIS — J301 Allergic rhinitis due to pollen: Secondary | ICD-10-CM | POA: Diagnosis not present

## 2024-01-21 DIAGNOSIS — F339 Major depressive disorder, recurrent, unspecified: Secondary | ICD-10-CM | POA: Diagnosis not present

## 2024-01-21 DIAGNOSIS — I10 Essential (primary) hypertension: Secondary | ICD-10-CM | POA: Diagnosis not present

## 2024-01-21 DIAGNOSIS — M818 Other osteoporosis without current pathological fracture: Secondary | ICD-10-CM | POA: Diagnosis not present

## 2024-01-21 DIAGNOSIS — E7800 Pure hypercholesterolemia, unspecified: Secondary | ICD-10-CM | POA: Diagnosis not present

## 2024-01-25 DIAGNOSIS — Z85820 Personal history of malignant melanoma of skin: Secondary | ICD-10-CM | POA: Diagnosis not present

## 2024-01-25 DIAGNOSIS — R202 Paresthesia of skin: Secondary | ICD-10-CM | POA: Diagnosis not present

## 2024-01-25 DIAGNOSIS — L578 Other skin changes due to chronic exposure to nonionizing radiation: Secondary | ICD-10-CM | POA: Diagnosis not present

## 2024-01-25 DIAGNOSIS — L570 Actinic keratosis: Secondary | ICD-10-CM | POA: Diagnosis not present

## 2024-01-25 DIAGNOSIS — L905 Scar conditions and fibrosis of skin: Secondary | ICD-10-CM | POA: Diagnosis not present

## 2024-01-25 DIAGNOSIS — L821 Other seborrheic keratosis: Secondary | ICD-10-CM | POA: Diagnosis not present

## 2024-01-25 DIAGNOSIS — Z86018 Personal history of other benign neoplasm: Secondary | ICD-10-CM | POA: Diagnosis not present

## 2024-01-25 DIAGNOSIS — D229 Melanocytic nevi, unspecified: Secondary | ICD-10-CM | POA: Diagnosis not present

## 2024-01-25 DIAGNOSIS — L814 Other melanin hyperpigmentation: Secondary | ICD-10-CM | POA: Diagnosis not present

## 2024-01-27 ENCOUNTER — Other Ambulatory Visit (HOSPITAL_COMMUNITY): Payer: Self-pay

## 2024-01-29 ENCOUNTER — Other Ambulatory Visit (HOSPITAL_COMMUNITY): Payer: Self-pay

## 2024-01-31 DIAGNOSIS — J3089 Other allergic rhinitis: Secondary | ICD-10-CM | POA: Diagnosis not present

## 2024-01-31 DIAGNOSIS — J3081 Allergic rhinitis due to animal (cat) (dog) hair and dander: Secondary | ICD-10-CM | POA: Diagnosis not present

## 2024-01-31 DIAGNOSIS — J301 Allergic rhinitis due to pollen: Secondary | ICD-10-CM | POA: Diagnosis not present

## 2024-02-07 DIAGNOSIS — J3089 Other allergic rhinitis: Secondary | ICD-10-CM | POA: Diagnosis not present

## 2024-02-07 DIAGNOSIS — J301 Allergic rhinitis due to pollen: Secondary | ICD-10-CM | POA: Diagnosis not present

## 2024-02-07 DIAGNOSIS — J3081 Allergic rhinitis due to animal (cat) (dog) hair and dander: Secondary | ICD-10-CM | POA: Diagnosis not present

## 2024-02-07 DIAGNOSIS — Z1231 Encounter for screening mammogram for malignant neoplasm of breast: Secondary | ICD-10-CM | POA: Diagnosis not present

## 2024-02-08 ENCOUNTER — Other Ambulatory Visit: Payer: Self-pay

## 2024-02-08 ENCOUNTER — Ambulatory Visit: Admitting: Sports Medicine

## 2024-02-08 VITALS — BP 134/82 | Ht 66.0 in | Wt 118.0 lb

## 2024-02-08 DIAGNOSIS — M79674 Pain in right toe(s): Secondary | ICD-10-CM

## 2024-02-08 NOTE — Progress Notes (Signed)
 Discussed the use of AI scribe software for clinical note transcription with the patient, who gave verbal consent to proceed.  History of Present Illness Tiffany Curtis is a 68 year old female who presents with persistent discomfort and swelling in the right great toe. She was referred by Dr. Elsa for evaluation of persistent toe discomfort and consideration of further treatment options.  Right great toe discomfort and swelling - Persistent discomfort in the right great toe for 3-4 months - Associated swelling, soreness, and occasional cracking of the toe - Pain is intermittent and varies in intensity - Discomfort worsens with activities such as walking 18 holes of golf, especially in less supportive shoes - No improvement with previous interventions, including a Z-Pak - No prior cortisone injection  Impact on physical activity - Maintains an active lifestyle, walking 15,000 to 20,000 steps five to six days per week - Plays tennis regularly - Toe discomfort limits activity, particularly during prolonged walking or when wearing less supportive footwear  Footwear and orthotic use - Foot specialist recommended a carbon fiber plate to reduce toe pressure, but use is inconsistent due to inconvenience - New shoe inserts are more comfortable, but uncertainty remains regarding correct shoe size, which may affect symptoms  Peripheral circulatory symptoms - Feet are often cold and sometimes appear blue  Physical Exam MUSCULOSKELETAL: Right great toe slightly tender on plantar flexion, less tender on dorsiflexion. Able to stand on tiptoes with discomfort in right great toe. Foot shape is relatively neutral Chronic changes at the first MTP joint on the right  MSK ultrasound of the right great toe The joint is well-visualized There is mild hypoechoic swelling in the joint There is some small spurring dorsally and on the medial side of the joint. Around a couple of the spurs there is some hypoechoic  swelling 1 extends dorsally and impinges on the extensor hallucis tendon On dynamic ultrasound there is no spur that blocks joint movement  Impression: Ultrasound findings consistent with mild to moderate osteoarthritis of the first MTP joint right foot  Ultrasound and interpretation by  dr. Prentice Agent and Tiffany Curtis. 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 Conservative management preferred to avoid surgery. - Ordered ultrasound of the right first MTP joint to assess soft tissue and bony spurring. - Continue use of carbon fiber plate to limit motion and offload pressure from the joint. She can also try without carbone insole and with only the first ray posting - Consider cushioned inserts or custom orthotics for additional support. - Apply topical Voltaren gel twice daily, especially before activities. - Discuss potential for cortisone injection if symptoms persist, acknowledging short-term relief. - Avoid surgical intervention unless conservative measures fail and symptoms become unmanageable.  If not improving we will recheck

## 2024-02-15 DIAGNOSIS — J301 Allergic rhinitis due to pollen: Secondary | ICD-10-CM | POA: Diagnosis not present

## 2024-02-15 DIAGNOSIS — J3081 Allergic rhinitis due to animal (cat) (dog) hair and dander: Secondary | ICD-10-CM | POA: Diagnosis not present

## 2024-02-15 DIAGNOSIS — J3089 Other allergic rhinitis: Secondary | ICD-10-CM | POA: Diagnosis not present

## 2024-02-21 DIAGNOSIS — J3081 Allergic rhinitis due to animal (cat) (dog) hair and dander: Secondary | ICD-10-CM | POA: Diagnosis not present

## 2024-02-21 DIAGNOSIS — J301 Allergic rhinitis due to pollen: Secondary | ICD-10-CM | POA: Diagnosis not present

## 2024-02-21 DIAGNOSIS — J3089 Other allergic rhinitis: Secondary | ICD-10-CM | POA: Diagnosis not present

## 2024-02-22 ENCOUNTER — Other Ambulatory Visit (HOSPITAL_COMMUNITY): Payer: Self-pay

## 2024-02-22 DIAGNOSIS — M545 Low back pain, unspecified: Secondary | ICD-10-CM | POA: Diagnosis not present

## 2024-02-22 MED ORDER — METHYLPREDNISOLONE 4 MG PO TBPK
ORAL_TABLET | ORAL | 0 refills | Status: AC
  Filled 2024-02-22: qty 21, 6d supply, fill #0

## 2024-03-01 ENCOUNTER — Other Ambulatory Visit (HOSPITAL_COMMUNITY): Payer: Self-pay

## 2024-03-08 ENCOUNTER — Encounter (HOSPITAL_BASED_OUTPATIENT_CLINIC_OR_DEPARTMENT_OTHER)
Admission: RE | Admit: 2024-03-08 | Discharge: 2024-03-08 | Disposition: A | Discharge status: Home / Self Care | Source: Ambulatory Visit | Attending: Orthopedic Surgery | Admitting: Orthopedic Surgery

## 2024-03-08 ENCOUNTER — Other Ambulatory Visit: Payer: Self-pay | Admitting: Orthopedic Surgery

## 2024-03-08 ENCOUNTER — Encounter (HOSPITAL_BASED_OUTPATIENT_CLINIC_OR_DEPARTMENT_OTHER): Payer: Self-pay | Admitting: Orthopedic Surgery

## 2024-03-08 ENCOUNTER — Other Ambulatory Visit: Payer: Self-pay

## 2024-03-08 DIAGNOSIS — Z01818 Encounter for other preprocedural examination: Secondary | ICD-10-CM | POA: Insufficient documentation

## 2024-03-08 NOTE — Progress Notes (Signed)
°   03/08/24 0933  PAT Phone Screen  Is the patient taking a GLP-1 receptor agonist? No  Do You Have Diabetes? No  Do You Have Hypertension? No  Have You Ever Been to the ER for Asthma? No  Have You Taken Oral Steroids in the Past 3 Months? No  Do you Take Phenteramine or any Other Diet Drugs? No  Recent  Lab Work, EKG, CXR? No  Do you have a history of heart problems? (S)  Yes (LBBB)  Cardiologist Name Dr Barbaraann for LBBB 2022  Have you ever had tests on your heart? Yes  What cardiac tests were performed? Echo;Stress Test  What date/year were cardiac tests completed? 2022 ECHO EF 65-70%, stress test nl, PFT's nl  Results viewable: [facility] Tab  Any Recent Hospitalizations? No  Height 5' 6 (1.676 m)  Weight 53.5 kg  Pat Appointment Scheduled (S)  Yes (EKG)

## 2024-03-09 ENCOUNTER — Ambulatory Visit (HOSPITAL_BASED_OUTPATIENT_CLINIC_OR_DEPARTMENT_OTHER)
Admission: RE | Admit: 2024-03-09 | Discharge: 2024-03-09 | Disposition: A | Discharge status: Home / Self Care | Attending: Orthopedic Surgery | Admitting: Orthopedic Surgery

## 2024-03-09 ENCOUNTER — Ambulatory Visit (HOSPITAL_BASED_OUTPATIENT_CLINIC_OR_DEPARTMENT_OTHER): Admitting: Anesthesiology

## 2024-03-09 ENCOUNTER — Encounter (HOSPITAL_BASED_OUTPATIENT_CLINIC_OR_DEPARTMENT_OTHER): Payer: Self-pay | Admitting: Orthopedic Surgery

## 2024-03-09 ENCOUNTER — Other Ambulatory Visit (HOSPITAL_COMMUNITY): Payer: Self-pay

## 2024-03-09 ENCOUNTER — Encounter (HOSPITAL_BASED_OUTPATIENT_CLINIC_OR_DEPARTMENT_OTHER)
Admission: RE | Disposition: A | Discharge status: Home / Self Care | Payer: Self-pay | Source: Home / Self Care | Attending: Orthopedic Surgery

## 2024-03-09 ENCOUNTER — Other Ambulatory Visit: Payer: Self-pay

## 2024-03-09 ENCOUNTER — Ambulatory Visit (HOSPITAL_BASED_OUTPATIENT_CLINIC_OR_DEPARTMENT_OTHER)

## 2024-03-09 DIAGNOSIS — W19XXXA Unspecified fall, initial encounter: Secondary | ICD-10-CM | POA: Diagnosis not present

## 2024-03-09 DIAGNOSIS — Y9373 Activity, racquet and hand sports: Secondary | ICD-10-CM | POA: Diagnosis not present

## 2024-03-09 DIAGNOSIS — S52572K Other intraarticular fracture of lower end of left radius, subsequent encounter for closed fracture with nonunion: Secondary | ICD-10-CM | POA: Insufficient documentation

## 2024-03-09 DIAGNOSIS — K219 Gastro-esophageal reflux disease without esophagitis: Secondary | ICD-10-CM | POA: Diagnosis not present

## 2024-03-09 DIAGNOSIS — I10 Essential (primary) hypertension: Secondary | ICD-10-CM | POA: Diagnosis not present

## 2024-03-09 DIAGNOSIS — Z87891 Personal history of nicotine dependence: Secondary | ICD-10-CM | POA: Diagnosis not present

## 2024-03-09 DIAGNOSIS — S52502A Unspecified fracture of the lower end of left radius, initial encounter for closed fracture: Secondary | ICD-10-CM

## 2024-03-09 HISTORY — PX: OPEN REDUCTION INTERNAL FIXATION (ORIF) DISTAL RADIAL FRACTURE: SHX5989

## 2024-03-09 SURGERY — OPEN REDUCTION INTERNAL FIXATION (ORIF) DISTAL RADIUS FRACTURE
Anesthesia: Monitor Anesthesia Care | Site: Wrist | Laterality: Left

## 2024-03-09 MED ORDER — MIDAZOLAM HCL 2 MG/2ML IJ SOLN
INTRAMUSCULAR | Status: AC
  Filled 2024-03-09: qty 2

## 2024-03-09 MED ORDER — DEXMEDETOMIDINE HCL IN NACL 80 MCG/20ML IV SOLN
INTRAVENOUS | Status: DC | PRN
  Administered 2024-03-09: 16:00:00 12 ug via INTRAVENOUS
  Administered 2024-03-09: 16:00:00 8 ug via INTRAVENOUS

## 2024-03-09 MED ORDER — PROPOFOL 500 MG/50ML IV EMUL
INTRAVENOUS | Status: AC
  Filled 2024-03-09: qty 50

## 2024-03-09 MED ORDER — FENTANYL CITRATE (PF) 100 MCG/2ML IJ SOLN
100.0000 ug | Freq: Once | INTRAMUSCULAR | Status: AC
  Administered 2024-03-09: 14:00:00 50 ug via INTRAVENOUS

## 2024-03-09 MED ORDER — MIDAZOLAM HCL (PF) 2 MG/2ML IJ SOLN
2.0000 mg | Freq: Once | INTRAMUSCULAR | Status: AC
  Administered 2024-03-09: 14:00:00 2 mg via INTRAVENOUS

## 2024-03-09 MED ORDER — HYDROCODONE-ACETAMINOPHEN 5-325 MG PO TABS: 1.0000 | ORAL_TABLET | Freq: Four times a day (QID) | ORAL | 0 refills | Status: AC | PRN

## 2024-03-09 MED ORDER — FENTANYL CITRATE (PF) 100 MCG/2ML IJ SOLN: 25.0000 ug | INTRAMUSCULAR | Status: DC | PRN

## 2024-03-09 MED ORDER — 0.9 % SODIUM CHLORIDE (POUR BTL) OPTIME
TOPICAL | Status: DC | PRN
  Administered 2024-03-09: 17:00:00 100 mL

## 2024-03-09 MED ORDER — LACTATED RINGERS IV SOLN: INTRAVENOUS | Status: DC

## 2024-03-09 MED ORDER — ROPIVACAINE HCL 5 MG/ML IJ SOLN
INTRAMUSCULAR | Status: DC | PRN
  Administered 2024-03-09: 14:00:00 30 mL via PERINEURAL

## 2024-03-09 MED ORDER — EPHEDRINE 5 MG/ML INJ
INTRAVENOUS | Status: AC
  Filled 2024-03-09: qty 5

## 2024-03-09 MED ORDER — AMISULPRIDE (ANTIEMETIC) 5 MG/2ML IV SOLN: 10.0000 mg | Freq: Once | INTRAVENOUS | Status: DC | PRN

## 2024-03-09 MED ORDER — ARTIFICIAL TEARS OPHTHALMIC OINT
TOPICAL_OINTMENT | OPHTHALMIC | Status: AC
  Filled 2024-03-09: qty 3.5

## 2024-03-09 MED ORDER — OXYCODONE HCL 5 MG PO TABS: 5.0000 mg | ORAL_TABLET | Freq: Once | ORAL | Status: DC | PRN

## 2024-03-09 MED ORDER — ACETAMINOPHEN 500 MG PO TABS
1000.0000 mg | ORAL_TABLET | Freq: Once | ORAL | Status: AC
  Administered 2024-03-09: 12:00:00 1000 mg via ORAL

## 2024-03-09 MED ORDER — ONDANSETRON HCL 4 MG/2ML IJ SOLN
INTRAMUSCULAR | Status: AC
  Filled 2024-03-09: qty 10

## 2024-03-09 MED ORDER — KETOROLAC TROMETHAMINE 30 MG/ML IJ SOLN
INTRAMUSCULAR | Status: AC
  Filled 2024-03-09: qty 2

## 2024-03-09 MED ORDER — OXYCODONE HCL 5 MG/5ML PO SOLN: 5.0000 mg | Freq: Once | ORAL | Status: DC | PRN

## 2024-03-09 MED ORDER — CEFAZOLIN SODIUM-DEXTROSE 2-4 GM/100ML-% IV SOLN
2.0000 g | INTRAVENOUS | Status: AC
  Administered 2024-03-09: 16:00:00 2 g via INTRAVENOUS

## 2024-03-09 MED ORDER — PHENYLEPHRINE 80 MCG/ML (10ML) SYRINGE FOR IV PUSH (FOR BLOOD PRESSURE SUPPORT)
PREFILLED_SYRINGE | INTRAVENOUS | Status: AC
  Filled 2024-03-09: qty 20

## 2024-03-09 MED ORDER — LOSARTAN POTASSIUM 100 MG PO TABS
100.0000 mg | ORAL_TABLET | Freq: Every day | ORAL | 2 refills | Status: AC
  Filled 2024-03-09: qty 90, 90d supply, fill #0

## 2024-03-09 MED ORDER — HYDRALAZINE HCL 20 MG/ML IJ SOLN
INTRAMUSCULAR | Status: AC
  Filled 2024-03-09: qty 1

## 2024-03-09 MED ORDER — ONDANSETRON HCL 4 MG/2ML IJ SOLN
INTRAMUSCULAR | Status: DC | PRN
  Administered 2024-03-09: 16:00:00 4 mg via INTRAVENOUS

## 2024-03-09 MED ORDER — ACETAMINOPHEN 500 MG PO TABS
ORAL_TABLET | ORAL | Status: AC
  Filled 2024-03-09: qty 2

## 2024-03-09 MED ORDER — DEXMEDETOMIDINE HCL IN NACL 80 MCG/20ML IV SOLN
INTRAVENOUS | Status: AC
  Filled 2024-03-09: qty 20

## 2024-03-09 MED ORDER — FENTANYL CITRATE (PF) 100 MCG/2ML IJ SOLN
INTRAMUSCULAR | Status: AC
  Filled 2024-03-09: qty 2

## 2024-03-09 MED ORDER — LIDOCAINE 2% (20 MG/ML) 5 ML SYRINGE
INTRAMUSCULAR | Status: AC
  Filled 2024-03-09: qty 10

## 2024-03-09 MED ORDER — CEFAZOLIN SODIUM-DEXTROSE 2-4 GM/100ML-% IV SOLN
INTRAVENOUS | Status: AC
  Filled 2024-03-09: qty 100

## 2024-03-09 MED ADMIN — Propofol IV Emul 500 MG/50ML (10 MG/ML): 125 ug/kg/min | INTRAVENOUS | @ 16:00:00 | NDC 00069023420

## 2024-03-09 MED ADMIN — Dexamethasone Sod Phosphate Preservative Free Inj 10 MG/ML: 10 mg | PERINEURAL | @ 14:00:00 | NDC 25021005301

## 2024-03-09 SURGICAL SUPPLY — 46 items
BIT DRILL 2.0 LNG QUCK RELEASE (BIT) IMPLANT
BIT DRILL QC 2.8X5 (BIT) IMPLANT
BLADE SURG 15 STRL LF DISP TIS (BLADE) ×2 IMPLANT
BNDG COMPR ESMARK 4X3 LF (GAUZE/BANDAGES/DRESSINGS) ×1 IMPLANT
BNDG ELASTIC 3INX 5YD STR LF (GAUZE/BANDAGES/DRESSINGS) ×1 IMPLANT
BNDG GAUZE DERMACEA FLUFF 4 (GAUZE/BANDAGES/DRESSINGS) ×1 IMPLANT
BNDG PLASTER X FAST 3X3 WHT LF (CAST SUPPLIES) ×10 IMPLANT
CHLORAPREP W/TINT 26 (MISCELLANEOUS) ×1 IMPLANT
CORD BIPOLAR FORCEPS 12FT (ELECTRODE) ×1 IMPLANT
COVER BACK TABLE 60X90IN (DRAPES) ×1 IMPLANT
COVER MAYO STAND STRL (DRAPES) ×1 IMPLANT
CUFF TOURN SGL QUICK 18X4 (TOURNIQUET CUFF) IMPLANT
CUFF TRNQT CYL 24X4X16.5-23 (TOURNIQUET CUFF) IMPLANT
DRAPE EXTREMITY T 121X128X90 (DISPOSABLE) ×1 IMPLANT
DRAPE OEC MINIVIEW 54X84 (DRAPES) ×1 IMPLANT
DRAPE SURG 17X23 STRL (DRAPES) ×1 IMPLANT
GAUZE SPONGE 4X4 12PLY STRL (GAUZE/BANDAGES/DRESSINGS) ×1 IMPLANT
GAUZE XEROFORM 1X8 LF (GAUZE/BANDAGES/DRESSINGS) ×1 IMPLANT
GLOVE BIO SURGEON STRL SZ7.5 (GLOVE) ×1 IMPLANT
GLOVE BIOGEL PI IND STRL 8 (GLOVE) ×1 IMPLANT
GLOVE BIOGEL PI IND STRL 8.5 (GLOVE) IMPLANT
GLOVE SURG ORTHO 8.0 STRL STRW (GLOVE) IMPLANT
GOWN STRL REUS W/ TWL LRG LVL3 (GOWN DISPOSABLE) ×1 IMPLANT
GOWN STRL REUS W/TWL XL LVL3 (GOWN DISPOSABLE) ×1 IMPLANT
GRAFT BNE CANC CHIPS 1-8 5CC (Bone Implant) IMPLANT
GUIDEWIRE ORTHO 0.054X6 (WIRE) IMPLANT
NDL HYPO 25X1 1.5 SAFETY (NEEDLE) IMPLANT
NEEDLE HYPO 25X1 1.5 SAFETY (NEEDLE) IMPLANT
PACK BASIN DAY SURGERY FS (CUSTOM PROCEDURE TRAY) ×1 IMPLANT
PAD CAST 3X4 CTTN HI CHSV (CAST SUPPLIES) ×1 IMPLANT
PLATE ACU LOC PROX STD LEFT (Plate) IMPLANT
SCREW ACTK 2 NL HEX 3.5.11 (Screw) IMPLANT
SCREW BN FT 16X2.3XLCK HEX CRT (Screw) IMPLANT
SCREW CORT FT 20X2.3XLCK HEX (Screw) IMPLANT
SCREW FX18X2.3XSMTH LCK NS CRT (Screw) IMPLANT
SCREW FX20X2.3XSMTH LCK NS CRT (Screw) IMPLANT
SCREW NONLOCK HEX 3.5X12 (Screw) IMPLANT
SLEEVE SCD COMPRESS KNEE MED (STOCKING) IMPLANT
SOLN 0.9% NACL POUR BTL 1000ML (IV SOLUTION) ×1 IMPLANT
STOCKINETTE 4X48 STRL (DRAPES) ×1 IMPLANT
SUT ETHILON 4 0 PS 2 18 (SUTURE) ×1 IMPLANT
SUT VIC AB 4-0 PS2 18 (SUTURE) ×1 IMPLANT
SYR BULB EAR ULCER 3OZ GRN STR (SYRINGE) ×1 IMPLANT
SYR CONTROL 10ML LL (SYRINGE) IMPLANT
TOWEL GREEN STERILE FF (TOWEL DISPOSABLE) ×2 IMPLANT
UNDERPAD 30X36 HEAVY ABSORB (UNDERPADS AND DIAPERS) ×1 IMPLANT

## 2024-03-09 NOTE — Anesthesia Procedure Notes (Signed)
 Anesthesia Regional Block: Supraclavicular block   Pre-Anesthetic Checklist: , timeout performed,  Correct Patient, Correct Site, Correct Laterality,  Correct Procedure, Correct Position, site marked,  Risks and benefits discussed,  Surgical consent,  Pre-op evaluation,  At surgeon's request and post-op pain management  Laterality: Left  Prep: Maximum Sterile Barrier Precautions used, chloraprep       Needles:  Injection technique: Single-shot  Needle Type: Echogenic Stimulator Needle     Needle Length: 5cm  Needle Gauge: 22     Additional Needles:   Procedures:,,,, ultrasound used (permanent image in chart),,    Narrative:  Start time: 03/09/2024 1:35 PM End time: 03/09/2024 1:38 PM Injection made incrementally with aspirations every 5 mL.  Performed by: Personally  Anesthesiologist: Niels Marien CROME, MD  Additional Notes: Monitors applied. No increased pain on injection. No increased resistance to injection. Injection made in 5cc increments. Good needle visualization. Patient tolerated procedure well.

## 2024-03-09 NOTE — Op Note (Signed)
 03/09/2024 [facility]  Operative Note  Pre Op Diagnosis: Left comminuted intraarticular distal radius fracture  Post Op Diagnosis: Left comminuted intraarticular distal radius fracture  Procedure:  ORIF Left comminuted intraarticular distal radius fracture, greater than three intraarticular fragments Left brachioradialis release  Surgeon: Franky Curia, MD  Assistant: Arley Curia, MD  Anesthesia: Regional with sedation  Fluids: Per anesthesia flow sheet  EBL: minimal  Complications: None  Specimen: None  Tourniquet Time:  Total Tourniquet Time Documented: Upper Arm (Left) - 52 minutes Total: Upper Arm (Left) - 52 minutes   Disposition: Stable to PACU  INDICATIONS:  Tiffany Curtis is a 68 y.o. female states she fell playing tennis last week.  Seen at [facility] where XR revealed distal radius fracture.  Splinted and followed up in office.  We discussed nonoperative and operative treatment options.  She wished to proceed with operative fixation.  Risks, benefits, and alternatives of surgery were discussed including the risk of blood loss; infection; damage to nerves, vessels, tendons, ligaments, bone; failure of surgery; need for additional surgery; complications with wound healing; continued pain; nonunion; malunion; stiffness.  We also discussed the possible need for bone graft and the benefits and risks including the possibility of disease transmission.  She voiced understanding of these risks and elected to proceed.    OPERATIVE COURSE:  After being identified preoperatively by myself, the patient and I agreed upon the procedure and site of procedure.  Surgical site was marked.   Surgical consent had been signed.  She was given IV Ancef  as preoperative antibiotic prophylaxis.  She was transferred to the operating room and placed on the operating room table in supine position with the Left upper extremity on an armboard. Sedation was induced by the anesthesiologist.  A regional  block had been performed by anesthesia in preoperative holding.  The Left upper extremity was prepped and draped in normal sterile orthopedic fashion.  A surgical pause was performed between the surgeons, anesthesia and operating room staff, and all were in agreement as to the patient, procedure and site of procedure.  Tourniquet at the proximal aspect of the extremity was inflated to 250 mmHg after exsanguination of the limb with an Esmarch bandage.  Standard volar Victory approach was used.  The bipolar electrocautery was used to obtain hemostasis.  The superficial and deep portions of the FCR tendon sheath were incised, and the FCR and FPL were swept ulnarly to protect the palmar cutaneous branch of the median nerve.  The brachioradialis was released at the radial side of the radius.  The pronator quadratus was released and elevated with the periosteal elevator.  The fracture site was identified and cleared of soft tissue interposition and hematoma.  It was reduced under direct visualization.  There was intraarticular extension creating four intraarticular fragments.   Cancellous bone graft was packed into the fracture site to provide support to the articular fragments. An AcuMed volar distal radial locking plate was selected.  It was secured to the bone with the guidepins.  C-arm was used in AP and lateral projections to ensure appropriate reduction and position of the hardware and adjustments made as necessary.  Standard AO drilling and measuring technique was used.  A single screw was placed in the slotted hole in the shaft of the plate.  The distal holes were filled with locking pegs with the exception of the styloid holes, which were filled with locking screws.  The remaining holes in the shaft of the plate were filled  with nonlocking screws.  Good purchase was obtained.  C-arm was used in AP, lateral and oblique projections to ensure appropriate reduction and position of hardware, which was the case.  There  was no intra-articular penetration of hardware.  The wound was copiously irrigated with sterile saline.  Pronator quadratus was repaired back over top of the plate using 4-0 Vicryl suture.  Vicryl suture was placed in the subcutaneous tissues in an inverted interrupted fashion and the skin was closed with 4-0 nylon in a horizontal mattress fashion.  There was good pronation and supination of the wrist without crepitance.  The wound was then dressed with sterile Xeroform, 4x4s, and wrapped with a Kerlix bandage.  A volar splint was placed and wrapped with Kerlix and Ace bandage.  Tourniquet was deflated at 52 minutes.  Fingertips were pink with brisk capillary refill after deflation of the tourniquet.  Operative drapes were broken down.  The patient was awoken from anesthesia safely.  She was transferred back to the stretcher and taken to the PACU in stable condition.  I will see her back in the office in one week for postoperative followup.  I will give her a prescription for Norco 5/325 1 tab PO q6 hours prn pain, dispense #20.    Tiffany Kachel, MD Electronically signed, 03/09/2024

## 2024-03-09 NOTE — Anesthesia Preprocedure Evaluation (Addendum)
 Anesthesia Evaluation  Patient identified by MRN, date of birth, ID band Patient awake    Reviewed: Allergy & Precautions, NPO status , Patient's Chart, lab work & pertinent test results  Airway Mallampati: II  TM Distance: >3 FB Neck ROM: Full    Dental no notable dental hx. (+) Teeth Intact, Dental Advisory Given   Pulmonary former smoker   Pulmonary exam normal breath sounds clear to auscultation       Cardiovascular hypertension, Pt. on medications Normal cardiovascular exam Rhythm:Regular Rate:Normal     Neuro/Psych  PSYCHIATRIC DISORDERS Anxiety Depression    negative neurological ROS     GI/Hepatic Neg liver ROS,GERD  ,,  Endo/Other  negative endocrine ROS    Renal/GU negative Renal ROS  negative genitourinary   Musculoskeletal negative musculoskeletal ROS (+)    Abdominal   Peds  Hematology negative hematology ROS (+)   Anesthesia Other Findings   Reproductive/Obstetrics                              Anesthesia Physical Anesthesia Plan  ASA: 2  Anesthesia Plan: MAC and Regional   Post-op Pain Management: Regional block* and Tylenol  PO (pre-op)*   Induction: Intravenous  PONV Risk Score and Plan: 2 and Propofol  infusion, Treatment may vary due to age or medical condition, Ondansetron  and Dexamethasone   Airway Management Planned: Natural Airway  Additional Equipment:   Intra-op Plan:   Post-operative Plan:   Informed Consent: I have reviewed the patients History and Physical, chart, labs and discussed the procedure including the risks, benefits and alternatives for the proposed anesthesia with the patient or authorized representative who has indicated his/her understanding and acceptance.     Dental advisory given  Plan Discussed with: CRNA  Anesthesia Plan Comments:          Anesthesia Quick Evaluation

## 2024-03-09 NOTE — Progress Notes (Signed)
Assisted Dr. Armond Hang with left, supraclavicular, ultrasound guided block. Side rails up, monitors on throughout procedure. See vital signs in flow sheet. Tolerated Procedure well.

## 2024-03-09 NOTE — Anesthesia Procedure Notes (Signed)
 Procedure Name: MAC Date/Time: 03/09/2024 4:20 PM  Performed by: Claudene Delon SQUIBB, CRNAPre-anesthesia Checklist: Patient identified, Emergency Drugs available, Suction available, Patient being monitored and Timeout performed Patient Re-evaluated:Patient Re-evaluated prior to induction Oxygen Delivery Method: Simple face mask Placement Confirmation: positive ETCO2 Dental Injury: Teeth and Oropharynx as per pre-operative assessment

## 2024-03-09 NOTE — Discharge Instructions (Addendum)
[facility] Center Instructions Hand Surgery  Wound Care: Keep your hand elevated above the level of your heart.  Do not allow it to dangle by your side.  Keep the dressing dry and do not remove it unless your doctor advises you to do so.  He will usually change it at the time of your post-op visit.  Moving your fingers is advised to stimulate circulation but will depend on the site of your surgery.  If you have a splint applied, your doctor will advise you regarding movement.  Activity: Do not drive or operate machinery today.  Rest today and then you may return to your normal activity and work as indicated by your physician.  Diet:  Drink liquids today or eat a light diet.  You may resume a regular diet tomorrow.    General expectations: Pain for two to three days. Fingers may become slightly swollen.  Call your doctor if any of the following occur: Severe pain not relieved by pain medication. Elevated temperature. Dressing soaked with blood. Inability to move fingers. White or bluish color to fingers.    Post Anesthesia Home Care Instructions  Activity: Get plenty of rest for the remainder of the day. A responsible individual must stay with you for 24 hours following the procedure.  For the next 24 hours, DO NOT: -Drive a car -Advertising copywriter -Drink alcoholic beverages -Take any medication unless instructed by your physician -Make any legal decisions or sign important papers.  Meals: Start with liquid foods such as gelatin or soup. Progress to regular foods as tolerated. Avoid greasy, spicy, heavy foods. If nausea and/or vomiting occur, drink only clear liquids until the nausea and/or vomiting subsides. Call your physician if vomiting continues.  Special Instructions/Symptoms: Your throat may feel dry or sore from the anesthesia or the breathing tube placed in your throat during surgery. If this causes discomfort, gargle with warm salt water. The discomfort should disappear  within 24 hours.     Regional Anesthesia Blocks  1. You may not be able to move or feel the "blocked" extremity after a regional anesthetic block. This may last may last from 3-48 hours after placement, but it will go away. The length of time depends on the medication injected and your individual response to the medication. As the nerves start to wake up, you may experience tingling as the movement and feeling returns to your extremity. If the numbness and inability to move your extremity has not gone away after 48 hours, please call your surgeon.   2. The extremity that is blocked will need to be protected until the numbness is gone and the strength has returned. Because you cannot feel it, you will need to take extra care to avoid injury. Because it may be weak, you may have difficulty moving it or using it. You may not know what position it is in without looking at it while the block is in effect.  3. For blocks in the legs and feet, returning to weight bearing and walking needs to be done carefully. You will need to wait until the numbness is entirely gone and the strength has returned. You should be able to move your leg and foot normally before you try and bear weight or walk. You will need someone to be with you when you first try to ensure you do not fall and possibly risk injury.  4. Bruising and tenderness at the needle site are common side effects and will resolve in a few days.  5. Persistent numbness or new problems with movement should be communicated to the surgeon or the [facility] [phone] [facility] [phone]).

## 2024-03-09 NOTE — H&P (Signed)
 Tiffany Curtis is an 68 y.o. female.   Chief Complaint: distal radius fracture HPI: 68 yo female states she fell 03/05/24 injuring left wrist while playing tennis.  Seen at [facility] where XR revealed distal radius fracture.  Splinted and followed up in the office.  She wishes to proceed with surgical fixation.  Allergies: Allergies[1]  Past Medical History:  Diagnosis Date   Anxiety and depression    Atypical mole 04/29/2021   Right Lateral Plantar Surface (mild)   GERD (gastroesophageal reflux disease)    H/O seasonal allergies    Hyperlipidemia    Hypertension    Lateral epicondylitis (tennis elbow)    right   SCC (squamous cell carcinoma) 09/16/1998   right temporal lesion Dr Helga   SCC (squamous cell carcinoma) 07/22/2005   right temple tx with bx   SCC (squamous cell carcinoma) 07/28/2012   right cheek tx with bx   Squamous cell carcinoma of skin 08/12/1998   right pre sideburn exc Dr Helga   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 Gladis Louder   COLONOSCOPY WITH PROPOFOL  N/A 11/18/2015   Procedure: COLONOSCOPY WITH PROPOFOL ;  Surgeon: Gladis MARLA Louder, MD;  Location: [facility];  Service: Endoscopy;  Laterality: N/A;   TYMPANOPLASTY     WRIST ARTHROSCOPY      Family History: Family History  Problem Relation Age of Onset   Liver cancer Mother    Heart disease Father 70    Social History:   reports that she has quit smoking. Her smoking use included cigarettes. She has a 20 pack-year smoking history. She has never used smokeless tobacco. She reports current alcohol use. She reports that she does not use drugs.  Medications: Medications Prior to Admission  Medication Sig Dispense Refill   amphetamine -dextroamphetamine  (ADDERALL XR) 20 MG 24 hr capsule Take 1 capsule (20 mg total) by mouth daily. 02/19/24 30 capsule 0    Calcium  Carb-Cholecalciferol (CALCIUM  + D3) 600-800 MG-UNIT TABS Take by mouth 2 (two) times daily.     desvenlafaxine  (PRISTIQ ) 50 MG 24 hr tablet Take 1 tablet (50 mg total) by mouth daily. 90 tablet 0   diphenhydrAMINE (BENADRYL) 25 mg capsule Take 50 mg by mouth at bedtime as needed for allergies.      losartan  (COZAAR ) 100 MG tablet Take 1 tablet (100 mg total) by mouth daily. 90 tablet 2   Multiple Vitamin (MULTIVITAMIN WITH MINERALS) TABS tablet Take 1 tablet by mouth daily.     rosuvastatin  (CRESTOR ) 5 MG tablet Take 1 tablet (5 mg total) by mouth daily. 90 tablet 3   traZODone  (DESYREL ) 50 MG tablet Take 1-2 tablets (50-100 mg total) by mouth at bedtime as needed for sleep. 180 tablet 1   TUBERCULIN SYR 1CC/27GX1/2 27G X 1/2 1 ML MISC by Does not apply route. Allergy shots weekly     clotrimazole  (LOTRIMIN ) 1 % cream Apply 1 Application topically 2 (two) times daily. 28 g 0   EPINEPHrine  0.3 mg/0.3 mL IJ SOAJ injection INJECT AS DIRECTED AS NEEDED FOR SYSTEMIC ALLERGIC REACTION 1 each 1   EPINEPHrine  0.3 mg/0.3 mL IJ SOAJ injection Use as directed inject as needed 1 each 1   KETOCONAZOLE , TOPICAL, 1 % SHAM Apply as directed twice weekly 120 mL 1    No results found for this or any previous visit (from the past 48  hours).  No results found.    Blood pressure (!) 146/86, pulse (!) 54, temperature 97.8 F (36.6 C), temperature source Temporal, resp. rate 20, height 5' 6 (1.676 m), weight 52 kg, SpO2 98%.  General appearance: alert, cooperative, and appears stated age Head: Normocephalic, without obvious abnormality, atraumatic Neck: supple, symmetrical, trachea midline Extremities: Intact sensation and capillary refill all digits.  +epl/fpl/io.  No wounds.  Skin: Skin color, texture, turgor normal. No rashes or lesions Neurologic: Grossly normal Incision/Wound: none  Assessment/Plan Left distal radius fracture.  Non operative and operative treatment options have been  discussed with the patient and patient wishes to proceed with operative treatment. Risks, benefits and alternatives of surgery were discussed including risks of blood loss, infection, damage to nerves/vessels/tendons/ligament/bone, failure of surgery, need for additional surgery, complication with wound healing, stiffness, nonunion, malunion.  Also discussed possible need for bone graft and that risk of disease transmission is low but present.  She voiced understanding of these risks and elected to proceed.    Butler Vegh 03/09/2024, 12:48 PM     [1]  Allergies Allergen Reactions   Short Ragweed Pollen Ext Other (See Comments)

## 2024-03-09 NOTE — Op Note (Signed)
 I assisted Surgeons and Role:    * Murrell Drivers, MD - Primary    DEWAINE Murrell Kuba, MD - Assisting on the Procedures: OPEN REDUCTION INTERNAL FIXATION (ORIF) DISTAL RADIUS FRACTURE on 03/09/2024.  I provided assistance on this case as follows: Set up, approach, protection of the radial artery, release of the brachial radialis, identification of the fracture, isolation of the fracture, debridement of the fracture, reduction and bone grafting of the fracture, stabilization of the fracture, fixation of the fracture with plate and screws followed by closure of the wound and application of the dressing and splint.  Electronically signed by: Kuba Murrell, MD Date: 03/09/2024 Time: 5:33 PM

## 2024-03-09 NOTE — Transfer of Care (Signed)
 Immediate Anesthesia Transfer of Care Note  Patient: Tiffany Curtis  Procedure(s) Performed: OPEN REDUCTION INTERNAL FIXATION (ORIF) DISTAL RADIUS FRACTURE (Left: Wrist)  Patient Location: PACU  Anesthesia Type:MAC combined with regional for post-op pain  Level of Consciousness: awake, alert , and oriented  Airway & Oxygen Therapy: Patient Spontanous Breathing and Patient connected to face mask oxygen  Post-op Assessment: Report given to RN and Post -op Vital signs reviewed and stable  Post vital signs: Reviewed and stable  Last Vitals:  Vitals Value Taken Time  BP 107/72 03/09/24 17:39  Temp    Pulse 51 03/09/24 17:44  Resp 13 03/09/24 17:44  SpO2 95 % 03/09/24 17:44  Vitals shown include unfiled device data.  Last Pain:  Vitals:   03/09/24 1201  TempSrc: Temporal  PainSc: 0-No pain         Complications: No notable events documented.

## 2024-03-10 ENCOUNTER — Encounter (HOSPITAL_BASED_OUTPATIENT_CLINIC_OR_DEPARTMENT_OTHER): Payer: Self-pay | Admitting: Orthopedic Surgery

## 2024-03-10 NOTE — Anesthesia Postprocedure Evaluation (Signed)
"   Anesthesia Post Note  Patient: Tiffany Curtis  Procedure(s) Performed: OPEN REDUCTION INTERNAL FIXATION (ORIF) DISTAL RADIUS FRACTURE (Left: Wrist)     Patient location during evaluation: PACU Anesthesia Type: Regional and MAC Level of consciousness: awake and alert Pain management: pain level controlled Vital Signs Assessment: post-procedure vital signs reviewed and stable Respiratory status: spontaneous breathing, nonlabored ventilation, respiratory function stable and patient connected to nasal cannula oxygen Cardiovascular status: stable and blood pressure returned to baseline Postop Assessment: no apparent nausea or vomiting Anesthetic complications: no   No notable events documented.  Last Vitals:  Vitals:   03/09/24 1815 03/09/24 1900  BP: 120/76 133/83  Pulse: (!) 56 (!) 58  Resp: 15 15  Temp:  36.8 C  SpO2: 95% 95%    Last Pain:  Vitals:   03/09/24 1900  TempSrc:   PainSc: 0-No pain                 Esmond Hinch L Raechal Raben      "

## 2024-03-21 ENCOUNTER — Ambulatory Visit: Admitting: Sports Medicine

## 2024-03-21 VITALS — Ht 66.0 in | Wt 118.0 lb

## 2024-03-21 DIAGNOSIS — M19071 Primary osteoarthritis, right ankle and foot: Secondary | ICD-10-CM | POA: Diagnosis not present

## 2024-03-21 NOTE — Progress Notes (Signed)
 Discussed the use of AI scribe software for clinical note transcription with the patient, who gave verbal consent to proceed.  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  Right first metatarsophalangeal (mtp) joint pain and function - Early osteoarthritis of the right first MTP joint. - Topical diclofenac gel applied three times daily with improved joint mobility and reduced soft tissue tightness and swelling. - Currently no right foot pain. - Able to play tennis, walk, and hike without limitation.  Bilateral bunionettes and cavus foot deformity - Bilateral bunionettes and cavus feet present. - Supportive shoes with arch support and cushioned inserts prevent foot pain. - Less supportive or flat footwear causes foot pain. - No abnormal callus formation or plantar pain.  Left foot symptoms and function - No pain or dysfunction in the left foot despite presence of a bunionette. - Able to tie shoes and perform daily activities without difficulty.  Recent distal radius fracture Left - Recent fall while playing tennis resulting in distal radius fracture. - Treated surgically with plate and screw fixation. - Awaiting suture removal. - Able to perform some activities such as tying shoes. - Plans to return to tennis.  Physical Exam MUSCULOSKELETAL:  Pleasant older F in NAD Ht 5' 6 (1.676 m)   Wt 118 lb (53.5 kg)   BMI 19.05 kg/m   Right great toe extension 45 degrees, flexion 20 degrees. Hypertrophy over dorsum of right first MTP joint, small bunionette on right fifth MTP joint. High arched cavus type foot.   Left foot extension 60 degrees, flexion 30 degrees, no joint hypertrophy. Bunionette with slight subluxation of left fifth MTP. No abnormal callus or pain pattern on plantar surface. High arch on left foot, slightly less than right.  Minimal splay between left toes one and two.  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 Mild inflammation persists without acute exacerbation. Activity pressure managed with inflammation control and supportive footwear. - Use topical diclofenac cream up to three times daily as needed. - Perform gentle range of motion exercises after warming the joint. - Use cushioned insoles with metatarsal support in athletic and walking shoes. - Wear supportive footwear such as [employer] or [employer] for tennis and walking. - Follow-up if pain or dysfunction recurs.  Bunionette, bilateral fifth metatarsophalangeal (MTP) joints Bilateral bunionettes with mild hypertrophy and slight subluxation on the left. No significant pain or functional limitation. - No intervention required unless pain or functional limitation develops.  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 High arches increase forefoot pressure and predispose to MTP joint pathology. Postmenopausal status increases risk for ligamentous laxity and arch breakdown, but arches remain well maintained. - Use supportive insoles or arch supports in all athletic, walking, and hiking shoes. - Avoid flat sandals or shoes without arch support; use brands with built-in support such as [employer] or [employer]. - Custom orthotics not indicated but may be considered if symptoms develop.

## 2024-03-21 NOTE — Assessment & Plan Note (Signed)
 Doing well with conservative care Diclofenac cream helps  Reck prn

## 2024-03-29 ENCOUNTER — Other Ambulatory Visit (HOSPITAL_COMMUNITY): Payer: Self-pay

## 2024-03-29 ENCOUNTER — Other Ambulatory Visit: Payer: Self-pay

## 2024-03-29 MED ORDER — DESVENLAFAXINE SUCCINATE ER 50 MG PO TB24: 50.0000 mg | ORAL_TABLET | Freq: Every day | ORAL | 0 refills | Status: AC

## 2024-03-29 MED ORDER — TRAZODONE HCL 50 MG PO TABS: 50.0000 mg | ORAL_TABLET | Freq: Every evening | ORAL | 0 refills | Status: DC | PRN

## 2024-03-29 MED ORDER — TRAZODONE HCL 50 MG PO TABS
50.0000 mg | ORAL_TABLET | Freq: Every evening | ORAL | 1 refills | Status: AC | PRN
  Filled 2024-03-29: qty 180, 90d supply, fill #0

## 2024-03-29 MED ORDER — DESVENLAFAXINE SUCCINATE ER 50 MG PO TB24
50.0000 mg | ORAL_TABLET | Freq: Every day | ORAL | 1 refills | Status: AC
  Filled 2024-03-29: qty 90, 90d supply, fill #0
